# Patient Record
Sex: Male | Born: 1975 | Race: White | Hispanic: No | Marital: Single | Smoking: Former smoker
Health system: Southern US, Community
[De-identification: ages and names within clinical notes are randomized; demographics above are authoritative.]

## PROBLEM LIST (undated history)

## (undated) ENCOUNTER — Emergency Department (HOSPITAL_COMMUNITY): Discharge status: Absent without leave | Payer: BLUE CROSS/BLUE SHIELD

## (undated) DIAGNOSIS — G43909 Migraine, unspecified, not intractable, without status migrainosus: Secondary | ICD-10-CM

## (undated) DIAGNOSIS — K589 Irritable bowel syndrome without diarrhea: Secondary | ICD-10-CM

## (undated) DIAGNOSIS — J45909 Unspecified asthma, uncomplicated: Secondary | ICD-10-CM

## (undated) DIAGNOSIS — F329 Major depressive disorder, single episode, unspecified: Secondary | ICD-10-CM

## (undated) DIAGNOSIS — R011 Cardiac murmur, unspecified: Secondary | ICD-10-CM

## (undated) DIAGNOSIS — F32A Depression, unspecified: Secondary | ICD-10-CM

## (undated) DIAGNOSIS — M1990 Unspecified osteoarthritis, unspecified site: Secondary | ICD-10-CM

## (undated) DIAGNOSIS — K219 Gastro-esophageal reflux disease without esophagitis: Secondary | ICD-10-CM

## (undated) HISTORY — PX: TONSILLECTOMY: SUR1361

## (undated) HISTORY — PX: HERNIA REPAIR: SHX51

## (undated) HISTORY — PX: COLONOSCOPY: SHX174

## (undated) HISTORY — PX: WRIST SURGERY: SHX841

---

## 2000-12-14 ENCOUNTER — Encounter: Payer: Self-pay | Admitting: Emergency Medicine

## 2000-12-14 ENCOUNTER — Emergency Department (HOSPITAL_COMMUNITY): Admission: EM | Admit: 2000-12-14 | Discharge: 2000-12-14 | Payer: Self-pay | Admitting: Emergency Medicine

## 2004-10-30 ENCOUNTER — Emergency Department (HOSPITAL_COMMUNITY): Admission: EM | Admit: 2004-10-30 | Discharge: 2004-10-30 | Payer: Self-pay | Admitting: Emergency Medicine

## 2009-05-28 ENCOUNTER — Emergency Department (HOSPITAL_BASED_OUTPATIENT_CLINIC_OR_DEPARTMENT_OTHER): Admission: EM | Admit: 2009-05-28 | Discharge: 2009-05-28 | Payer: Self-pay | Admitting: Emergency Medicine

## 2009-05-28 ENCOUNTER — Ambulatory Visit: Payer: Self-pay | Admitting: Radiology

## 2009-10-21 ENCOUNTER — Ambulatory Visit: Payer: Self-pay | Admitting: Emergency Medicine

## 2009-10-21 DIAGNOSIS — M25539 Pain in unspecified wrist: Secondary | ICD-10-CM | POA: Insufficient documentation

## 2009-10-21 DIAGNOSIS — S62109A Fracture of unspecified carpal bone, unspecified wrist, initial encounter for closed fracture: Secondary | ICD-10-CM | POA: Insufficient documentation

## 2009-10-30 ENCOUNTER — Ambulatory Visit (HOSPITAL_BASED_OUTPATIENT_CLINIC_OR_DEPARTMENT_OTHER): Admission: RE | Admit: 2009-10-30 | Discharge: 2009-10-30 | Payer: Self-pay | Admitting: Orthopedic Surgery

## 2010-04-15 NOTE — Assessment & Plan Note (Signed)
Summary: RIGHT WRIST PAIN   Vital Signs:  Patient Profile:   35 Years Old Male CC:      Rt wrist pain after hitting a punching bag x 3 days Height:     71 inches Weight:      170 pounds O2 Sat:      98 % O2 treatment:    Room Air Temp:     98.6 degrees F oral Pulse rate:   89 / minute Pulse rhythm:   regular Resp:     12 per minute BP sitting:   130 / 87  (left arm) Cuff size:   large  Pt. in pain?   yes    Intensity:   4    Type:       dull                   Current Allergies: ! * SHELLFISHHistory of Present Illness Chief Complaint: Rt wrist pain after hitting a punching bag x 3 days History of Present Illness: Developed R wrist pain 3 days ago.  Was using a punching bag, but didn't wrap his wrist.  He thinks it is sprained, but it is still swollen and tender.  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  Pain is dull with associated swelling.  Ice, elevation, and NSAID's all help somewhat.  REVIEW OF SYSTEMS Constitutional Symptoms      Denies fever, chills, night sweats, weight loss, weight gain, and fatigue.  Eyes       Denies change in vision, eye pain, eye discharge, glasses, contact lenses, and eye surgery. Ear/Nose/Throat/Mouth       Denies hearing loss/aids, change in hearing, ear pain, ear discharge, dizziness, frequent runny nose, frequent nose bleeds, sinus problems, sore throat, hoarseness, and tooth pain or bleeding.  Respiratory       Denies dry cough, productive cough, wheezing, shortness of breath, asthma, bronchitis, and emphysema/COPD.  Cardiovascular       Denies murmurs, chest pain, and tires easily with exhertion.    Gastrointestinal       Denies stomach pain, nausea/vomiting, diarrhea, constipation, blood in bowel movements, and indigestion. Genitourniary       Denies painful urination, kidney stones, and loss of urinary control. Neurological       Denies paralysis, seizures, and fainting/blackouts. Musculoskeletal       Complains of  muscle pain, joint pain, joint stiffness, decreased range of motion, and swelling.      Denies redness, muscle weakness, and gout.  Skin       Denies bruising, unusual mles/lumps or sores, and hair/skin or nail changes.  Psych       Denies mood changes, temper/anger issues, anxiety/stress, speech problems, depression, and sleep problems.  Past History:  Past Medical History: Unremarkable  Past Surgical History: Denies surgical history  Family History: Mother, D, Crohns Father, D,Throat CA  Social History: Non smoker ETOH-yes No DRugs IT Physical Exam General appearance: well developed, well nourished, no acute distress Skin: no obvious rashes or lesions MSE: oriented to time, place, and person Right wrist: FROM, DNVI, TTP at snuffbox and radial aspect.  No TTP ulnar or forearm.  Swelling over radial wrist.  Assessment New Problems: FRACTURE, WRIST, RIGHT (ICD-814.00) WRIST PAIN, RIGHT (ICD-719.43)  Xray: scaphoid waist fracture  Patient Education: Ice, rest, elevation  Plan New Orders: New Patient Level III [99203] T-DG Wrist Complete*R* [73110] Planning Comments:   Wear thumb spica splint Follow up with sports medicine  next week, they will likely place a cast at that time   The patient and/or caregiver has been counseled thoroughly with regard to medications prescribed including dosage, schedule, interactions, rationale for use, and possible side effects and they verbalize understanding.  Diagnoses and expected course of recovery discussed and will return if not improved as expected or if the condition worsens. Patient and/or caregiver verbalized understanding.   Orders Added: 1)  New Patient Level III [99203] 2)  T-DG Wrist Complete*R* [73110]  Appended Document: RIGHT WRIST PAIN Gave Right thumb spica splint to patient.

## 2010-05-30 LAB — POCT HEMOGLOBIN-HEMACUE: Hemoglobin: 15.6 g/dL (ref 13.0–17.0)

## 2010-06-09 LAB — URINALYSIS, ROUTINE W REFLEX MICROSCOPIC
Bilirubin Urine: NEGATIVE
Glucose, UA: NEGATIVE mg/dL
Hgb urine dipstick: NEGATIVE
Ketones, ur: NEGATIVE mg/dL
Nitrite: NEGATIVE
Protein, ur: NEGATIVE mg/dL
Specific Gravity, Urine: 1.006 (ref 1.005–1.030)
Urobilinogen, UA: 0.2 mg/dL (ref 0.0–1.0)
pH: 6 (ref 5.0–8.0)

## 2010-06-09 LAB — CBC
HCT: 42.8 % (ref 39.0–52.0)
Hemoglobin: 14.6 g/dL (ref 13.0–17.0)
RBC: 4.41 MIL/uL (ref 4.22–5.81)
RDW: 12.9 % (ref 11.5–15.5)
WBC: 7.2 10*3/uL (ref 4.0–10.5)

## 2010-06-09 LAB — DIFFERENTIAL
Basophils Absolute: 0.1 10*3/uL (ref 0.0–0.1)
Lymphocytes Relative: 17 % (ref 12–46)
Lymphs Abs: 1.2 10*3/uL (ref 0.7–4.0)
Monocytes Absolute: 0.4 10*3/uL (ref 0.1–1.0)
Monocytes Relative: 5 % (ref 3–12)
Neutro Abs: 5.2 10*3/uL (ref 1.7–7.7)

## 2010-06-09 LAB — BASIC METABOLIC PANEL
Calcium: 9.7 mg/dL (ref 8.4–10.5)
GFR calc Af Amer: >60 mL/min (ref >60)
GFR calc non Af Amer: >60 mL/min (ref >60)
Potassium: 4.1 mEq/L (ref 3.5–5.1)
Sodium: 145 mEq/L (ref 135–145)

## 2010-10-18 ENCOUNTER — Ambulatory Visit
Admission: RE | Admit: 2010-10-18 | Discharge: 2010-10-18 | Disposition: A | Discharge status: Home / Self Care | Payer: BC Managed Care – PPO | Source: Ambulatory Visit | Attending: Emergency Medicine | Admitting: Emergency Medicine

## 2010-10-18 ENCOUNTER — Other Ambulatory Visit: Payer: Self-pay | Admitting: Emergency Medicine

## 2010-10-18 ENCOUNTER — Inpatient Hospital Stay (INDEPENDENT_AMBULATORY_CARE_PROVIDER_SITE_OTHER)
Admission: RE | Admit: 2010-10-18 | Discharge: 2010-10-18 | Disposition: A | Discharge status: Home / Self Care | Payer: BC Managed Care – PPO | Source: Ambulatory Visit | Attending: Emergency Medicine | Admitting: Emergency Medicine

## 2010-10-18 ENCOUNTER — Encounter: Payer: Self-pay | Admitting: Emergency Medicine

## 2010-10-18 DIAGNOSIS — M25579 Pain in unspecified ankle and joints of unspecified foot: Secondary | ICD-10-CM

## 2010-10-19 ENCOUNTER — Telehealth (INDEPENDENT_AMBULATORY_CARE_PROVIDER_SITE_OTHER): Payer: Self-pay | Admitting: Emergency Medicine

## 2011-02-16 NOTE — Progress Notes (Signed)
Summary: ANKLE INJ (room 4)   Vital Signs:  Patient Profile:   35 Years Old Male CC:      twisted left ankle last evening Height:     71 inches Weight:      177 pounds O2 Sat:      99 % O2 treatment:    Room Air Temp:     98.1 degrees F oral Pulse rate:   84 / minute Resp:     16 per minute BP sitting:   141 / 85  (left arm) Cuff size:   regular  Pt. in pain?   yes    Location:   left foot/ankle    Intensity:   7    Type:       sharp                   Updated Prior Medication List: PROTONIX 40 MG TBEC (PANTOPRAZOLE SODIUM)   Current Allergies (reviewed today): ! * SHELLFISHHistory of Present Illness History from: patient Chief Complaint: twisted left ankle last evening History of Present Illness: L ankle pain since last night.  He stepped in a hole and twisted his ankle.  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  He feels that today is worse in terms of swelling and pain.  He is able to walk but it hurts.  Not using any meds or modalities.  REVIEW OF SYSTEMS Constitutional Symptoms      Denies fever, chills, night sweats, weight loss, weight gain, and fatigue.  Eyes       Denies change in vision, eye pain, eye discharge, glasses, contact lenses, and eye surgery. Ear/Nose/Throat/Mouth       Denies hearing loss/aids, change in hearing, ear pain, ear discharge, dizziness, frequent runny nose, frequent nose bleeds, sinus problems, sore throat, hoarseness, and tooth pain or bleeding.  Respiratory       Denies dry cough, productive cough, wheezing, shortness of breath, asthma, bronchitis, and emphysema/COPD.  Cardiovascular       Denies murmurs, chest pain, and tires easily with exhertion.    Gastrointestinal       Denies stomach pain, nausea/vomiting, diarrhea, constipation, blood in bowel movements, and indigestion. Genitourniary       Denies painful urination, kidney stones, and loss of urinary control. Neurological     Denies paralysis, seizures, and fainting/blackouts. Musculoskeletal       Complains of muscle pain, joint pain, joint stiffness, decreased range of motion, and swelling.      Denies redness, muscle weakness, and gout.  Skin       Denies bruising, unusual mles/lumps or sores, and hair/skin or nail changes.  Psych       Denies mood changes, temper/anger issues, anxiety/stress, speech problems, depression, and sleep problems. Other Comments: left foot/ankle injury    Past History:  Family History: Last updated: 10/18/2010 Mother, D, Crohns Father, D,Throat CA Family History Breast cancer 1st degree relative <50  Social History: Last updated: 10/21/2009 Non smoker ETOH-yes No DRugs IT  Past Medical History: Reviewed history from 10/21/2009 and no changes required. Unremarkable  Past Surgical History: Reviewed history from 10/21/2009 and no changes required. Denies surgical history  Family History: Reviewed history from 10/21/2009 and no changes required. Mother, D, Crohns Father, D,Throat CA Family History Breast cancer 1st degree relative <50  Social History: Reviewed history from 10/21/2009 and no changes required. Non smoker ETOH-yes No DRugs IT Physical Exam General  appearance: well developed, well nourished, no acute distress MSE: oriented to time, place, and person L ankle: FROM, full strength. +TTP lateral malleolus. No TTP medial malleolus, navicular, base of 5th, calcaneus, Achilles, or proximal fibula.  +swelling lateral ankle.  Mild ecchymoses. Distal NV status intact.  Antalgic gait. Assessment New Problems: FAMILY HISTORY BREAST CANCER 1ST DEGREE RELATIVE <50 (ICD-V16.3) ANKLE PAIN, LEFT (ICD-719.47)   Plan New Orders: Est. Patient Level IV [99214] T-DG Ankle Complete*L* [73610] Crutches [E0110] Ambulatory Surgical Boot ea [L3260] Planning Comments:   Xray ordered and read by radiology as nondisplaced oblique distal fibula fracture.  Will  place in boot and put NWB until seen by ortho this week to prevent movement of the bone.  He has seen Margaretha Sheffield and Eulah Pont in the past so will send back to them.  Encourage ice, elevation, rest.    The patient and/or caregiver has been counseled thoroughly with regard to medications prescribed including dosage, schedule, interactions, rationale for use, and possible side effects and they verbalize understanding.  Diagnoses and expected course of recovery discussed and will return if not improved as expected or if the condition worsens. Patient and/or caregiver verbalized understanding.   Orders Added: 1)  Est. Patient Level IV [16109] 2)  T-DG Ankle Complete*L* [73610] 3)  Crutches [E0110] 4)  Ambulatory Surgical Boot ea [L3260]

## 2011-02-16 NOTE — Telephone Encounter (Signed)
  Phone Note Call from Patient   Caller: Patient Reason for Call: Talk to Doctor Summary of Call: Patient calling to request pain medication; none was requested or ordered yesterday. Spoke with Dr.Wade and phoned in rx for: Hydrocodone5/325; 1 tablet q 8hrs as needed; #20; no refills; [facility] . Initial call taken by: Lavell Islam RN,  October 19, 2010 4:07 PM

## 2011-08-16 ENCOUNTER — Encounter: Payer: Self-pay | Admitting: *Deleted

## 2011-08-16 ENCOUNTER — Emergency Department
Admission: EM | Admit: 2011-08-16 | Discharge: 2011-08-16 | Disposition: A | Discharge status: Home / Self Care | Payer: BC Managed Care – PPO | Source: Home / Self Care | Attending: Emergency Medicine | Admitting: Emergency Medicine

## 2011-08-16 ENCOUNTER — Emergency Department
Admit: 2011-08-16 | Discharge: 2011-08-16 | Disposition: A | Discharge status: Home / Self Care | Payer: BC Managed Care – PPO

## 2011-08-16 DIAGNOSIS — M25539 Pain in unspecified wrist: Secondary | ICD-10-CM

## 2011-08-16 DIAGNOSIS — M25532 Pain in left wrist: Secondary | ICD-10-CM

## 2011-08-16 NOTE — ED Notes (Signed)
Pt injured L wrist Friday

## 2011-08-16 NOTE — ED Provider Notes (Signed)
History     CSN: [id number]  Arrival date & time 08/16/11  1535   First MD Initiated Contact with Patient 08/16/11 1538      Chief Complaint  Patient presents with  . Wrist Injury    L    (Consider location/radiation/quality/duration/timing/severity/associated sxs/prior treatment) HPI This is a right-handed white male who presents today with left wrist pain.  He was playing basketball and fell on an outstretched hand.  He states the pain is mild but constant sore.  He is concerned because he has a scaphoid fracture about 15 years ago and wants to make sure that everything is okay in the wrist.  He has not been using any medications or modalities yet.  No other injuries noted that during the fall.  History reviewed. No pertinent past medical history.  Past Surgical History  Procedure Date  . Wrist surgery     R    Family History  Problem Relation Age of Onset  . Cancer Mother   . Cancer Father     History  Substance Use Topics  . Smoking status: Never Smoker   . Smokeless tobacco: Not on file  . Alcohol Use: Yes      Review of Systems  All other systems reviewed and are negative.    Allergies  Pollen extract and Shellfish allergy  Home Medications   Current Outpatient Rx  Name Route Sig Dispense Refill  . PANTOPRAZOLE SODIUM 40 MG PO TBEC Oral Take 40 mg by mouth daily.      BP 133/97  Pulse 93  Temp(Src) 98.7 F (37.1 C) (Oral)  Resp 18  SpO2 98%  Physical Exam  Nursing note and vitals reviewed. Constitutional: He is oriented to person, place, and time. He appears well-developed and well-nourished.  HENT:  Head: Normocephalic and atraumatic.  Eyes: No scleral icterus.  Neck: Neck supple.  Cardiovascular: Regular rhythm and normal heart sounds.   Pulmonary/Chest: Effort normal and breath sounds normal. No respiratory distress.  Musculoskeletal:       Left wrist examination demonstrates full range of motion of the joints including flexion  extension ulnar and radial deviation with supination and pronation.  He has no snuffbox tenderness but generalized dorsal wrist tenderness.  There is no swelling or ecchymoses.  There is mild tenderness to palpation around the TFCC as well.  Neurological: He is alert and oriented to person, place, and time.  Skin: Skin is warm and dry.  Psychiatric: He has a normal mood and affect. His speech is normal.    ED Course  Procedures (including critical care time)  Labs Reviewed - No data to display Dg Wrist Complete Left  08/16/2011  *RADIOLOGY REPORT*  Clinical Data: Status post fall 2 days ago.  Wrist pain.  LEFT WRIST - COMPLETE 3+ VIEW  Comparison: None.  Findings: No acute bony or joint abnormality is identified.  Ulnar positive variance is noted.  Soft tissues are unremarkable.  IMPRESSION: No acute finding.  Original Report Authenticated By: Bernadene Bell. D'ALESSIO, M.D.     1. Left wrist pain       MDM   An x-ray was obtained and read by the radiologist as above.  Likely is a wrist sprain. Encourage rest, ice, compression with ACE bandage and/or a brace, and elevation of injured body part.  The role of anti-inflammatories is discussed with the patient.  We placed him in a thumb spica splint.  Marlaine Hind, MD 08/16/11 [phone]

## 2013-01-06 ENCOUNTER — Emergency Department (INDEPENDENT_AMBULATORY_CARE_PROVIDER_SITE_OTHER): Payer: BC Managed Care – PPO

## 2013-01-06 ENCOUNTER — Emergency Department
Admission: EM | Admit: 2013-01-06 | Discharge: 2013-01-06 | Disposition: A | Discharge status: Home / Self Care | Payer: BC Managed Care – PPO | Source: Home / Self Care | Attending: Family Medicine | Admitting: Family Medicine

## 2013-01-06 ENCOUNTER — Encounter: Payer: Self-pay | Admitting: Emergency Medicine

## 2013-01-06 DIAGNOSIS — M792 Neuralgia and neuritis, unspecified: Secondary | ICD-10-CM

## 2013-01-06 DIAGNOSIS — M542 Cervicalgia: Secondary | ICD-10-CM

## 2013-01-06 DIAGNOSIS — IMO0002 Reserved for concepts with insufficient information to code with codable children: Secondary | ICD-10-CM

## 2013-01-06 HISTORY — DX: Gastro-esophageal reflux disease without esophagitis: K21.9

## 2013-01-06 HISTORY — DX: Irritable bowel syndrome, unspecified: K58.9

## 2013-01-06 HISTORY — DX: Migraine, unspecified, not intractable, without status migrainosus: G43.909

## 2013-01-06 MED ORDER — PREDNISONE 20 MG PO TABS: 20.0000 mg | ORAL_TABLET | Freq: Two times a day (BID) | ORAL | Status: DC

## 2013-01-06 MED ORDER — HYDROCODONE-ACETAMINOPHEN 5-325 MG PO TABS: ORAL_TABLET | ORAL | Status: DC

## 2013-01-06 MED ORDER — CYCLOBENZAPRINE HCL 10 MG PO TABS: 10.0000 mg | ORAL_TABLET | Freq: Two times a day (BID) | ORAL | Status: DC | PRN

## 2013-01-06 NOTE — ED Notes (Signed)
Pt c/o RT sided neck pain radiating to his RT shoulder x 5 days. Denies injury. He has taken Advil and OTC Percogesic with no relief. He also reports taking a vicodin last night.

## 2013-01-06 NOTE — ED Provider Notes (Signed)
CSN: [id number]     Arrival date & time 01/06/13  1324 History   None    Chief Complaint  Patient presents with  . Neck Pain      HPI Comments: Patient reports that he has had a long history of occasional right neck stiffness that resolves spontaneously.  Five days ago he developed right neck pain that was worse than usual and has persisted.  He then developed a shooting/burning pain in his right shoulder area when rotating his head to the left, flexing his neck to the left, and looking upward.  No loss of strength.  No fevers, chills, and sweats.  He feels well otherwise.  The pain awakens him at night.  Last night he used left-over Vicodin from a previous injury.  Ibuprofen 400mg  has not been effective.  He denies recent injury or change in physical activities.  The history is provided by the patient.    Past Medical History  Diagnosis Date  . GERD (gastroesophageal reflux disease)   . IBS (irritable bowel syndrome)   . Migraine    Past Surgical History  Procedure Laterality Date  . Wrist surgery      R  . Tonsillectomy    . Wrist surgery     Family History  Problem Relation Age of Onset  . Cancer Mother     breast  . Cancer Father     esophageal   History  Substance Use Topics  . Smoking status: Never Smoker   . Smokeless tobacco: Not on file  . Alcohol Use: Yes    Review of Systems  Constitutional: Negative.   HENT: Negative.   Eyes: Negative.   Respiratory: Negative.   Cardiovascular: Negative.   Gastrointestinal: Negative.   Endocrine: Negative.   Genitourinary: Negative.   Musculoskeletal: Positive for neck pain and neck stiffness.  Skin: Negative.   Neurological: Negative for weakness, numbness and headaches.    Allergies  Pollen extract and Shellfish allergy  Home Medications   Current Outpatient Rx  Name  Route  Sig  Dispense  Refill  . pantoprazole (PROTONIX) 40 MG tablet   Oral   Take 40 mg by mouth daily.          BP 139/97  Pulse 81   Temp(Src) 98.2 F (36.8 C) (Oral)  Resp 18  Ht 5\' 10"  (1.778 m)  Wt 182 lb (82.555 kg)  BMI 26.11 kg/m2  SpO2 99% Physical Exam  Nursing note and vitals reviewed. Constitutional: He is oriented to person, place, and time. He appears well-developed and well-nourished. No distress.  HENT:  Head: Normocephalic and atraumatic.  Mouth/Throat: Oropharynx is clear and moist.  Eyes: Conjunctivae and EOM are normal. Pupils are equal, round, and reactive to light.  Neck: Neck supple. No thyromegaly present.  Cardiovascular: Normal heart sounds.   Pulmonary/Chest: Breath sounds normal.  Musculoskeletal:       Cervical back: He exhibits decreased range of motion and tenderness. He exhibits no bony tenderness, no swelling, no edema and no deformity.       Back:  Patient has a small area of mild tenderness at base of neck posteriorly as noted on diagram.  His area of radicular discomfort is noted over right trapezius and shoulder as noted on diagram.  Patient has decreased range of motion in his neck. Distal sensation and strength intact.  Elbow and patellar reflexes normal.  Lymphadenopathy:    He has no cervical adenopathy.  Neurological: He is alert and oriented to person, place, and time.  Skin: Skin is warm and dry. No rash noted.    ED Course  Procedures  none     Imaging Review Dg Cervical Spine Complete  01/06/2013   CLINICAL DATA:  Right lateral neck pain radiating into the right shoulder.  EXAM: CERVICAL SPINE  4+ VIEWS  COMPARISON:  No priors.  FINDINGS: Six views of the cervical spine demonstrate no acute displaced fracture. Alignment is anatomic. Prevertebral soft tissues are normal. Mild multilevel degenerative disc disease and facet arthropathy, most severe at C6-C7.   IMPRESSION: 1. No acute radiographic abnormality of the cervical spine to account for the patient's symptoms.   Electronically Signed   By: Trudie Reed M.D.   On: 01/06/2013 15:14      MDM   1. Neck pain on right side   2. Radicular pain in right arm; primarily C6-C7, without evidence weakness    Begin prednisone burst.  Rx for Flexeril.  Lortab at bedtime.  Begin applying ice pack several times daily.  Minimize physical activity. If symptoms become significantly worse during the night or over the weekend, proceed to the local emergency room.  Followup with Dr. Rodney Langton in one week.    Lattie Haw, MD 01/06/13 1630

## 2013-01-13 ENCOUNTER — Encounter: Payer: Self-pay | Admitting: Sports Medicine

## 2013-01-13 ENCOUNTER — Ambulatory Visit (INDEPENDENT_AMBULATORY_CARE_PROVIDER_SITE_OTHER): Payer: BC Managed Care – PPO | Admitting: Sports Medicine

## 2013-01-13 VITALS — BP 153/96 | HR 92 | Wt 185.0 lb

## 2013-01-13 DIAGNOSIS — M5412 Radiculopathy, cervical region: Secondary | ICD-10-CM

## 2013-01-13 NOTE — Progress Notes (Signed)
   Subjective:    I'm seeing this patient as a consultation for:  Dr. Cathren Harsh  CC: Neck and arm pain  HPI: This is a very pleasant 37 year old male, he comes in with neck pain radiating down the right arm, initially it did come down all the way to the fingertips. Is moderate, persistent. He went to urgent care, was given Flexeril, prednisone, Norco, and is feeling much better now. He has not been doing any rehabilitation exercises. Denies any bowel or bladder dysfunction, no constitutional symptoms, or change in gait.  Past medical history, Surgical history, Family history not pertinant except as noted below, Social history, Allergies, and medications have been entered into the medical record, reviewed, and no changes needed.   Review of Systems: No headache, visual changes, nausea, vomiting, diarrhea, constipation, dizziness, abdominal pain, skin rash, fevers, chills, night sweats, weight loss, swollen lymph nodes, body aches, joint swelling, muscle aches, chest pain, shortness of breath, mood changes, visual or auditory hallucinations.   Objective:   General: Well Developed, well nourished, and in no acute distress.  Neuro/Psych: Alert and oriented x3, extra-ocular muscles intact, able to move all 4 extremities, sensation grossly intact. Skin: Warm and dry, no rashes noted.  Respiratory: Not using accessory muscles, speaking in full sentences, trachea midline.  Cardiovascular: Pulses palpable, no extremity edema. Abdomen: Does not appear distended. Neck: Inspection unremarkable. No palpable stepoffs. Negative Spurling's maneuver. Full neck range of motion Grip strength and sensation normal in bilateral hands Strength good C4 to T1 distribution No sensory change to C4 to T1 Negative Hoffman sign bilaterally Reflexes normal  X-rays were reviewed, the usual mild DJD with anterior bridging osteophytes at the C6-C7 level.  Impression and Recommendations:   This case required medical  decision making of moderate complexity.

## 2013-01-13 NOTE — Assessment & Plan Note (Signed)
Right-sided, C7 versus C8. X-rays do confirm C6-C7 degenerative disc disease. Overall doing fantastic with prednisone and Flexeril. Adding home rehabilitation. Return one month, MRI for interventional injection planning if no better.

## 2013-02-17 ENCOUNTER — Encounter: Payer: Self-pay | Admitting: Sports Medicine

## 2013-02-17 ENCOUNTER — Ambulatory Visit (INDEPENDENT_AMBULATORY_CARE_PROVIDER_SITE_OTHER): Payer: BC Managed Care – PPO | Admitting: Sports Medicine

## 2013-02-17 VITALS — BP 139/88 | HR 85 | Wt 186.0 lb

## 2013-02-17 DIAGNOSIS — M5412 Radiculopathy, cervical region: Secondary | ICD-10-CM

## 2013-02-17 DIAGNOSIS — IMO0002 Reserved for concepts with insufficient information to code with codable children: Secondary | ICD-10-CM

## 2013-02-17 DIAGNOSIS — M5416 Radiculopathy, lumbar region: Secondary | ICD-10-CM | POA: Insufficient documentation

## 2013-02-17 MED ORDER — CYCLOBENZAPRINE HCL 10 MG PO TABS: 10.0000 mg | ORAL_TABLET | Freq: Three times a day (TID) | ORAL | Status: DC | PRN

## 2013-02-17 NOTE — Progress Notes (Signed)
  Subjective:    CC: Followup  HPI: Cervical radiculitis: Improved approximately 80+ percent better with conservative measures including physical therapy, steroids, ibuprofen, and Flexeril. Desires a refill on Flexeril and does not need to proceed any further with MRIs for intervention.  Left leg numbness colon with back pain, noted recently, symptoms did recently disappear and he does not desire to pursue treatment for this just yet. He would like some conservative measures including exercises.  Past medical history, Surgical history, Family history not pertinant except as noted below, Social history, Allergies, and medications have been entered into the medical record, reviewed, and no changes needed.   Review of Systems: No fevers, chills, night sweats, weight loss, chest pain, or shortness of breath.   Objective:    General: Well Developed, well nourished, and in no acute distress.  Neuro: Alert and oriented x3, extra-ocular muscles intact, sensation grossly intact.  HEENT: Normocephalic, atraumatic, pupils equal round reactive to light, neck supple, no masses, no lymphadenopathy, thyroid nonpalpable.  Skin: Warm and dry, no rashes. Cardiac: Regular rate and rhythm, no murmurs rubs or gallops, no lower extremity edema.  Respiratory: Clear to auscultation bilaterally. Not using accessory muscles, speaking in full sentences. Neck: Inspection unremarkable. No palpable stepoffs. Negative Spurling's maneuver. Full neck range of motion Grip strength and sensation normal in bilateral hands Strength good C4 to T1 distribution No sensory change to C4 to T1 Negative Hoffman sign bilaterally Reflexes normal Back Exam:  Inspection: Unremarkable  Motion: Flexion 45 deg, Extension 45 deg, Side Bending to 45 deg bilaterally,  Rotation to 45 deg bilaterally  SLR laying: Negative  XSLR laying: Negative  Palpable tenderness: None. FABER: negative. Sensory change: Gross sensation intact to  all lumbar and sacral dermatomes.  Reflexes: 2+ at both patellar tendons, 2+ at achilles tendons, Babinski's downgoing.  Strength at foot  Plantar-flexion: 5/5 Dorsi-flexion: 5/5 Eversion: 5/5 Inversion: 5/5  Leg strength  Quad: 5/5 Hamstring: 5/5 Hip flexor: 5/5 Hip abductors: 5/5  Gait unremarkable.  Impression and Recommendations:

## 2013-02-17 NOTE — Assessment & Plan Note (Signed)
80% improved with conservative measures, did well with Flexeril, and an occasional Advil. Refilling this. Continue home exercises, return in 3 months. No MRI or injections needed.

## 2013-02-17 NOTE — Assessment & Plan Note (Signed)
We will start conservatively with home rehabilitation exercises. Return in about 3 months for this.

## 2013-05-12 ENCOUNTER — Telehealth: Payer: Self-pay

## 2013-05-12 DIAGNOSIS — M5412 Radiculopathy, cervical region: Secondary | ICD-10-CM

## 2013-05-12 NOTE — Telephone Encounter (Signed)
Patient called request refill for Flexeril he stated that he re aggravated his back .  [facility] advise [facility] [place].Rhonda Cunningham,CMA

## 2013-05-15 MED ORDER — CYCLOBENZAPRINE HCL 10 MG PO TABS
10.0000 mg | ORAL_TABLET | Freq: Three times a day (TID) | ORAL | Status: DC | PRN
Start: 2013-05-15 — End: 2013-05-19

## 2013-05-15 NOTE — Telephone Encounter (Signed)
Done

## 2013-05-16 ENCOUNTER — Other Ambulatory Visit: Payer: Self-pay | Admitting: Family Medicine

## 2013-05-19 ENCOUNTER — Ambulatory Visit (INDEPENDENT_AMBULATORY_CARE_PROVIDER_SITE_OTHER): Payer: BC Managed Care – PPO | Admitting: Sports Medicine

## 2013-05-19 ENCOUNTER — Encounter: Payer: Self-pay | Admitting: Sports Medicine

## 2013-05-19 VITALS — BP 139/86 | HR 81 | Ht 70.0 in | Wt 186.0 lb

## 2013-05-19 DIAGNOSIS — M5412 Radiculopathy, cervical region: Secondary | ICD-10-CM

## 2013-05-19 DIAGNOSIS — IMO0002 Reserved for concepts with insufficient information to code with codable children: Secondary | ICD-10-CM

## 2013-05-19 DIAGNOSIS — M5416 Radiculopathy, lumbar region: Secondary | ICD-10-CM

## 2013-05-19 MED ORDER — CYCLOBENZAPRINE HCL 10 MG PO TABS: 10.0000 mg | ORAL_TABLET | Freq: Three times a day (TID) | ORAL | Status: DC | PRN

## 2013-05-19 NOTE — Progress Notes (Signed)
  Subjective:    CC: Followup  HPI: Left lumbar radiculitis: Resolved with conservative measures and Flexeril.  Cervical radiculitis: Resolved with conservative measures and Flexeril.  Past medical history, Surgical history, Family history not pertinant except as noted below, Social history, Allergies, and medications have been entered into the medical record, reviewed, and no changes needed.   Review of Systems: No fevers, chills, night sweats, weight loss, chest pain, or shortness of breath.   Objective:    General: Well Developed, well nourished, and in no acute distress.  Neuro: Alert and oriented x3, extra-ocular muscles intact, sensation grossly intact.  HEENT: Normocephalic, atraumatic, pupils equal round reactive to light, neck supple, no masses, no lymphadenopathy, thyroid nonpalpable.  Skin: Warm and dry, no rashes. Cardiac: Regular rate and rhythm, no murmurs rubs or gallops, no lower extremity edema.  Respiratory: Clear to auscultation bilaterally. Not using accessory muscles, speaking in full sentences. Neck: Inspection unremarkable. No palpable stepoffs. Negative Spurling's maneuver. Full neck range of motion Grip strength and sensation normal in bilateral hands Strength good C4 to T1 distribution No sensory change to C4 to T1 Negative Hoffman sign bilaterally Reflexes normal Back Exam:  Inspection: Unremarkable  Motion: Flexion 45 deg, Extension 45 deg, Side Bending to 45 deg bilaterally,  Rotation to 45 deg bilaterally  SLR laying: Negative  XSLR laying: Negative  Palpable tenderness: None. FABER: negative. Sensory change: Gross sensation intact to all lumbar and sacral dermatomes.  Reflexes: 2+ at both patellar tendons, 2+ at achilles tendons, Babinski's downgoing.  Strength at foot  Plantar-flexion: 5/5 Dorsi-flexion: 5/5 Eversion: 5/5 Inversion: 5/5  Leg strength  Quad: 5/5 Hamstring: 5/5 Hip flexor: 5/5 Hip abductors: 5/5  Gait  unremarkable.  Impression and Recommendations:

## 2013-05-19 NOTE — Assessment & Plan Note (Signed)
Resolved, refilling Flexeril.  It was inadvertently sent to the wrong pharmacy.

## 2013-05-19 NOTE — Assessment & Plan Note (Signed)
Resolved, continue rehabilitation exercises and an occasional Flexeril as needed. Return to see me on an as-needed basis.

## 2013-11-17 ENCOUNTER — Emergency Department (INDEPENDENT_AMBULATORY_CARE_PROVIDER_SITE_OTHER): Payer: BC Managed Care – PPO

## 2013-11-17 ENCOUNTER — Emergency Department
Admission: EM | Admit: 2013-11-17 | Discharge: 2013-11-17 | Disposition: A | Discharge status: Home / Self Care | Payer: BC Managed Care – PPO | Source: Home / Self Care | Attending: Family Medicine | Admitting: Family Medicine

## 2013-11-17 ENCOUNTER — Encounter: Payer: Self-pay | Admitting: Emergency Medicine

## 2013-11-17 DIAGNOSIS — S239XXA Sprain of unspecified parts of thorax, initial encounter: Secondary | ICD-10-CM

## 2013-11-17 DIAGNOSIS — S29012A Strain of muscle and tendon of back wall of thorax, initial encounter: Secondary | ICD-10-CM

## 2013-11-17 DIAGNOSIS — IMO0002 Reserved for concepts with insufficient information to code with codable children: Secondary | ICD-10-CM

## 2013-11-17 MED ORDER — METAXALONE 800 MG PO TABS: 800.0000 mg | ORAL_TABLET | Freq: Three times a day (TID) | ORAL | Status: DC

## 2013-11-17 MED ORDER — MELOXICAM 15 MG PO TABS: 15.0000 mg | ORAL_TABLET | Freq: Every day | ORAL | Status: DC

## 2013-11-17 NOTE — ED Notes (Signed)
Mid back pain x 4 days, hurts to pivot, lean back, side to side. Resting pain 3 goes to 10 when pivoting," takes my breath"

## 2013-11-17 NOTE — Discharge Instructions (Signed)
Apply ice pack for 20 to 30 minutes, 3 to 4 times daily  Continue until pain decreases. May take Norco/Vicodin at bedtime as needed.  Avoid lifting.   Back Pain, Adult Low back pain is very common. About 1 in 5 people have back pain.The cause of low back pain is rarely dangerous. The pain often gets better over time.About half of people with a sudden onset of back pain feel better in just 2 weeks. About 8 in 10 people feel better by 6 weeks.  CAUSES Some common causes of back pain include:  Strain of the muscles or ligaments supporting the spine.  Wear and tear (degeneration) of the spinal discs.  Arthritis.  Direct injury to the back. DIAGNOSIS Most of the time, the direct cause of low back pain is not known.However, back pain can be treated effectively even when the exact cause of the pain is unknown.Answering your caregiver's questions about your overall health and symptoms is one of the most accurate ways to make sure the cause of your pain is not dangerous. If your caregiver needs more information, he or she may order lab work or imaging tests (X-rays or MRIs).However, even if imaging tests show changes in your back, this usually does not require surgery. HOME CARE INSTRUCTIONS For many people, back pain returns.Since low back pain is rarely dangerous, it is often a condition that people can learn to [facility] their own.   Remain active. It is stressful on the back to sit or stand in one place. Do not sit, drive, or stand in one place for more than 30 minutes at a time. Take short walks on level surfaces as soon as pain allows.Try to increase the length of time you walk each day.  Do not stay in bed.Resting more than 1 or 2 days can delay your recovery.  Do not avoid exercise or work.Your body is made to move.It is not dangerous to be active, even though your back may hurt.Your back will likely heal faster if you return to being active before your pain is gone.  Pay  attention to your body when you bend and lift. Many people have less discomfortwhen lifting if they bend their knees, keep the load close to their bodies,and avoid twisting. Often, the most comfortable positions are those that put less stress on your recovering back.  Find a comfortable position to sleep. Use a firm mattress and lie on your side with your knees slightly bent. If you lie on your back, put a pillow under your knees.  Only take over-the-counter or prescription medicines as directed by your caregiver. Over-the-counter medicines to reduce pain and inflammation are often the most helpful.Your caregiver may prescribe muscle relaxant drugs.These medicines help dull your pain so you can more quickly return to your normal activities and healthy exercise.  Put ice on the injured area.  Put ice in a plastic bag.  Place a towel between your skin and the bag.  Leave the ice on for 15-20 minutes, 03-04 times a day for the first 2 to 3 days. After that, ice and heat may be alternated to reduce pain and spasms.  Ask your caregiver about trying back exercises and gentle massage. This may be of some benefit.  Avoid feeling anxious or stressed.Stress increases muscle tension and can worsen back pain.It is important to recognize when you are anxious or stressed and learn ways to manage it.Exercise is a great option. SEEK MEDICAL CARE IF:  You have pain that is  not relieved with rest or medicine.  You have pain that does not improve in 1 week.  You have new symptoms.  You are generally not feeling well. SEEK IMMEDIATE MEDICAL CARE IF:   You have pain that radiates from your back into your legs.  You develop new bowel or bladder control problems.  You have unusual weakness or numbness in your arms or legs.  You develop nausea or vomiting.  You develop abdominal pain.  You feel faint. Document Released: 03/02/2005 Document Revised: 09/01/2011 Document Reviewed:  07/04/2013 [facility] Patient Information 2015 [place], [place]. This information is not intended to replace advice given to you by your health care provider. Make sure you discuss any questions you have with your health care provider.

## 2013-11-17 NOTE — ED Provider Notes (Signed)
CSN: [id number]     Arrival date & time 11/17/13  1457 History   First MD Initiated Contact with Patient 11/17/13 1536     Chief Complaint  Patient presents with  . Back Pain      HPI Comments: Patient reports that he returned from an extended golfing trip eight days ago, but recalls no injury.  Four days ago he recalls lifting a box overhead, and the next day he developed a constant ache in his middle back.  The pain is worse with extension of his back, and rotation of his back.  Pain decreases when sitting quietly.  Patient is a 38 y.o. male presenting with back pain. The history is provided by the patient.  Back Pain Location:  Thoracic spine Quality:  Aching and stabbing Radiates to:  Does not radiate Pain severity:  Moderate Pain is:  Same all the time Onset quality:  Sudden Duration:  3 days Timing:  Intermittent Progression:  Unchanged Chronicity:  New Context: lifting heavy objects   Relieved by:  Nothing Worsened by:  Twisting and bending Ineffective treatments:  None tried Associated symptoms: no abdominal pain, no chest pain, no fever, no leg pain, no numbness, no tingling and no weakness     Past Medical History  Diagnosis Date  . GERD (gastroesophageal reflux disease)   . IBS (irritable bowel syndrome)   . Migraine    Past Surgical History  Procedure Laterality Date  . Wrist surgery      R  . Tonsillectomy    . Wrist surgery     Family History  Problem Relation Age of Onset  . Cancer Mother     breast  . Cancer Father     esophageal   History  Substance Use Topics  . Smoking status: Former Smoker    Types: Cigarettes    Quit date: 11/18/1998  . Smokeless tobacco: Not on file  . Alcohol Use: Yes    Review of Systems  Constitutional: Negative for fever.  Cardiovascular: Negative for chest pain.  Gastrointestinal: Negative for abdominal pain.  Musculoskeletal: Positive for back pain.  Neurological: Negative for tingling, weakness and numbness.    All other systems reviewed and are negative.   Allergies  Pollen extract and Shellfish allergy  Home Medications   Prior to Admission medications   Medication Sig Start Date End Date Taking? Authorizing Provider  HYDROcodone-acetaminophen (NORCO/VICODIN) 5-325 MG per tablet Take one by mouth at bedtime as needed for pain 01/06/13   Lattie Haw, MD  meloxicam (MOBIC) 15 MG tablet Take 1 tablet (15 mg total) by mouth daily. Take with food each morning 11/17/13   Lattie Haw, MD  metaxalone (SKELAXIN) 800 MG tablet Take 1 tablet (800 mg total) by mouth 3 (three) times daily. 11/17/13   Lattie Haw, MD  pantoprazole (PROTONIX) 40 MG tablet Take 40 mg by mouth daily.    Historical Provider, MD   BP 149/99  Pulse 87  Temp(Src) 98.3 F (36.8 C) (Oral)  Ht  (1.778 m)  Wt 180 lb (81.647 kg)  BMI 25.83 kg/m2  SpO2 100% Physical Exam  Nursing note and vitals reviewed. Constitutional: He is oriented to person, place, and time. He appears well-developed and well-nourished. No distress.  HENT:  Head: Normocephalic.  Mouth/Throat: Oropharynx is clear and moist.  Eyes: Conjunctivae are normal. Pupils are equal, round, and reactive to light.  Neck: Normal range of motion.  Cardiovascular: Normal heart sounds.   Pulmonary/Chest: Breath  sounds normal.  Abdominal: There is no tenderness.  Musculoskeletal: He exhibits no edema.       Thoracic back: He exhibits tenderness.       Back:  There is tenderness mid-thoracic back as noted on diagram.    Lymphadenopathy:    He has no cervical adenopathy.  Neurological: He is alert and oriented to person, place, and time.  Skin: Skin is warm and dry. No rash noted.    ED Course  Procedures  None    Imaging Review Dg Thoracic Spine 2 View  11/17/2013   CLINICAL DATA:  Three days of lower thoracic spine pain  EXAM: THORACIC SPINE - 2 VIEW  COMPARISON:  Abdominal series of May 28, 2009  FINDINGS: The images are technically limited.  The thoracic vertebral bodies are preserved in height. The intervertebral disc spaces exhibit mild narrowing diffusely where visualized. There is no spondylolisthesis. There are no abnormal paravertebral soft tissue densities.  IMPRESSION: There is no acute bony abnormality of the lumbar spine. Mild degenerative disc space narrowing is present at multiple levels.   Electronically Signed   By: David  [place]   On: 11/17/2013 16:18     MDM   1. Upper back strain, initial encounter    Begin Skelaxin and Mobic. Apply ice pack for 20 to 30 minutes, 3 to 4 times daily  Continue until pain decreases. May take Norco/Vicodin at bedtime as needed.  Avoid lifting. Followup with Dr. Rodney [facility] ([facility]) if not improving about two weeks.     Lattie Haw, MD 11/20/13 [phone]

## 2014-09-12 ENCOUNTER — Emergency Department (INDEPENDENT_AMBULATORY_CARE_PROVIDER_SITE_OTHER): Payer: BLUE CROSS/BLUE SHIELD

## 2014-09-12 ENCOUNTER — Encounter: Payer: Self-pay | Admitting: *Deleted

## 2014-09-12 ENCOUNTER — Emergency Department (INDEPENDENT_AMBULATORY_CARE_PROVIDER_SITE_OTHER)
Admission: EM | Admit: 2014-09-12 | Discharge: 2014-09-12 | Disposition: A | Discharge status: Home / Self Care | Payer: BLUE CROSS/BLUE SHIELD | Source: Home / Self Care | Attending: Family Medicine | Admitting: Family Medicine

## 2014-09-12 DIAGNOSIS — M25531 Pain in right wrist: Secondary | ICD-10-CM | POA: Diagnosis not present

## 2014-09-12 DIAGNOSIS — S63501A Unspecified sprain of right wrist, initial encounter: Secondary | ICD-10-CM | POA: Diagnosis not present

## 2014-09-12 NOTE — ED Provider Notes (Signed)
CSN: [id number]     Arrival date & time 09/12/14  1454 History   First MD Initiated Contact with Patient 09/12/14 1536     Chief Complaint  Patient presents with  . Wrist Pain      HPI Comments: Patient was working out with a boxing bag at home just over two weeks ago when he developed soreness in his right wrist that has persisted.  He has a past history of right scaphoid fracture in 2012 with ORIF.   Patient is a 39 y.o. male presenting with wrist pain. The history is provided by the patient.  Wrist Pain This is a new problem. Episode onset: 2 weeks ago. The problem occurs constantly. The problem has been gradually improving. Exacerbated by: grasping. Nothing relieves the symptoms. He has tried nothing for the symptoms.    Past Medical History  Diagnosis Date  . GERD (gastroesophageal reflux disease)   . IBS (irritable bowel syndrome)   . Migraine    Past Surgical History  Procedure Laterality Date  . Wrist surgery      R  . Tonsillectomy    . Wrist surgery    . Hernia repair     Family History  Problem Relation Age of Onset  . Cancer Mother     breast  . Cancer Father     esophageal  . Diabetes Father    History  Substance Use Topics  . Smoking status: Former Smoker    Types: Cigarettes    Quit date: 11/18/1998  . Smokeless tobacco: Not on file  . Alcohol Use: Yes     Comment: 15-20 q wk    Review of Systems  All other systems reviewed and are negative.   Allergies  Pollen extract and Shellfish allergy  Home Medications   Prior to Admission medications   Medication Sig Start Date End Date Taking? Authorizing Provider  HYDROcodone-acetaminophen (NORCO/VICODIN) 5-325 MG per tablet Take one by mouth at bedtime as needed for pain 01/06/13   Lattie Haw, MD  meloxicam (MOBIC) 15 MG tablet Take 1 tablet (15 mg total) by mouth daily. Take with food each morning 11/17/13   Lattie Haw, MD  metaxalone (SKELAXIN) 800 MG tablet Take 1 tablet (800 mg total) by  mouth 3 (three) times daily. 11/17/13   Lattie Haw, MD  pantoprazole (PROTONIX) 40 MG tablet Take 40 mg by mouth daily.    Historical Provider, MD   BP 176/100 mmHg  Pulse 98  Temp(Src) 98.4 F (36.9 C) (Oral)  Resp 16  Ht  (1.803 m)  Wt 186 lb (84.369 kg)  BMI 25.95 kg/m2  SpO2 98% Physical Exam  Constitutional: He is oriented to person, place, and time. He appears well-developed and well-nourished. No distress.  HENT:  Head: Atraumatic.  Eyes: Conjunctivae are normal. Pupils are equal, round, and reactive to light.  Musculoskeletal:       Right wrist: He exhibits tenderness and bony tenderness. He exhibits normal range of motion, no swelling and no crepitus.       Arms: Right wrist has full range of motion.  No swelling.  There is mild tenderness over the snuff box.  Distal neurovascular function is intact.   Neurological: He is alert and oriented to person, place, and time.  Skin: Skin is warm and dry.    ED Course  Procedures  none  Imaging Review Dg Wrist Complete Right  09/12/2014   CLINICAL DATA:  Initial encounter for radial sided  pain after trauma 10 days ago. Surgery 4 years ago.  EXAM: RIGHT WRIST - COMPLETE 3+ VIEW  COMPARISON:  10/21/2009  FINDINGS: Screw fixation of the scaphoid. No acute fracture or dislocation. No acute hardware complication.  IMPRESSION: No acute osseous abnormality.   Electronically Signed   By: Jeronimo GreavesKyle  Talbot M.D.   On: 09/12/2014 15:27     MDM   1. Sprain of right wrist, initial encounter     Wear wrist brace for about 5 days.  Begin range of motion exercises.  Followup with orthopedist if symptoms persist.    Lattie HawStephen A Reine Bristow, MD 09/12/14 [phone]

## 2014-09-12 NOTE — ED Notes (Signed)
Pt c/o RT wrist pain x 1 1/2 wk after hitting a boxing bag at home. History of RT wrist fracture with screw in 2012.

## 2014-09-12 NOTE — Discharge Instructions (Signed)
Wear wrist brace for about 5 days.  Begin range of motion exercises.

## 2014-10-25 ENCOUNTER — Ambulatory Visit (INDEPENDENT_AMBULATORY_CARE_PROVIDER_SITE_OTHER): Payer: BLUE CROSS/BLUE SHIELD | Admitting: Sports Medicine

## 2014-10-25 ENCOUNTER — Ambulatory Visit (INDEPENDENT_AMBULATORY_CARE_PROVIDER_SITE_OTHER): Payer: BLUE CROSS/BLUE SHIELD

## 2014-10-25 ENCOUNTER — Encounter: Payer: Self-pay | Admitting: Sports Medicine

## 2014-10-25 VITALS — BP 144/94 | HR 87 | Ht 70.0 in | Wt 186.0 lb

## 2014-10-25 DIAGNOSIS — M79672 Pain in left foot: Secondary | ICD-10-CM

## 2014-10-25 MED ORDER — PREDNISONE 10 MG (21) PO TBPK: ORAL_TABLET | ORAL | Status: DC

## 2014-10-25 MED ORDER — METAXALONE 800 MG PO TABS: 800.0000 mg | ORAL_TABLET | Freq: Three times a day (TID) | ORAL | Status: DC

## 2014-10-25 NOTE — Progress Notes (Signed)
   Subjective:    I'm seeing this patient as a consultation for:  Dr. Dewain Penning  CC: left foot pain  HPI: For the past several weeks this pleasant 39 year male has had increasing pain and swelling of the dorsum of his midfoot without radiation, no trauma, pain is moderate, persistent. No history of gout.  Past medical history, Surgical history, Family history not pertinant except as noted below, Social history, Allergies, and medications have been entered into the medical record, reviewed, and no changes needed.   Review of Systems: No headache, visual changes, nausea, vomiting, diarrhea, constipation, dizziness, abdominal pain, skin rash, fevers, chills, night sweats, weight loss, swollen lymph nodes, body aches, joint swelling, muscle aches, chest pain, shortness of breath, mood changes, visual or auditory hallucinations.   Objective:   General: Well Developed, well nourished, and in no acute distress.  Neuro/Psych: Alert and oriented x3, extra-ocular muscles intact, able to move all 4 extremities, sensation grossly intact. Skin: Warm and dry, no rashes noted.  Respiratory: Not using accessory muscles, speaking in full sentences, trachea midline.  Cardiovascular: Pulses palpable, no extremity edema. Abdomen: Does not appear distended. Right Foot: No visible erythema or swelling. Range of motion is full in all directions. Strength is 5/5 in all directions. No hallux valgus. No pes cavus or pes planus. No abnormal callus noted. No pain over the navicular prominence, or base of fifth metatarsal. No tenderness to palpation of the calcaneal insertion of plantar fascia. No pain at the Achilles insertion. No pain over the calcaneal bursa. No pain of the retrocalcaneal bursa. No tenderness to palpation over the tarsals, metatarsals, or phalanges. No hallux rigidus or limitus. Tender to palpation at the dorsal midfoot at the base of the third and fourth metatarsals in the junction with  the lateral cuneiform as well as cuboid No pain with compression of the metatarsal heads. Neurovascularly intact distally.  X-rays reviewed and are unremarkable  Impression and Recommendations:   This case required medical decision making of moderate complexity.

## 2014-10-25 NOTE — Assessment & Plan Note (Signed)
Dorsal midfoot pain suggestive of intertarsal synovitis. We will start conservatively with prednisone, x-rays, uric acid levels, return for custom molded orthotics. If no improvement we will try and intertarsal injection. 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

## 2014-10-26 LAB — URIC ACID: Uric Acid, Serum: 6.2 mg/dL (ref 4.0–7.8)

## 2014-11-01 ENCOUNTER — Encounter: Payer: Self-pay | Admitting: Sports Medicine

## 2014-11-01 ENCOUNTER — Ambulatory Visit (INDEPENDENT_AMBULATORY_CARE_PROVIDER_SITE_OTHER): Payer: BLUE CROSS/BLUE SHIELD | Admitting: Sports Medicine

## 2014-11-01 VITALS — BP 142/97 | HR 87 | Wt 184.0 lb

## 2014-11-01 DIAGNOSIS — M79672 Pain in left foot: Secondary | ICD-10-CM

## 2014-11-01 MED ORDER — TRAMADOL HCL 50 MG PO TABS: ORAL_TABLET | ORAL | Status: DC

## 2014-11-01 NOTE — Assessment & Plan Note (Signed)
Persistent pain over the dorsum of the second metatarsal shaft radiating into the second metatarsophalangeal joint. No response to prednisone. Custom orthotics as above, x-rays were negative, at this point it is starting to appear like a second metatarsal stress reaction. Postop shoe. He does have a golf tournament coming up, he will place the orthotics into the golf shoes, and then switch back into the postop shoe when not on the golf course. Return in 4 weeks, MRI if no better.

## 2014-11-01 NOTE — Progress Notes (Signed)

## 2014-11-29 ENCOUNTER — Ambulatory Visit (INDEPENDENT_AMBULATORY_CARE_PROVIDER_SITE_OTHER): Payer: BLUE CROSS/BLUE SHIELD | Admitting: Sports Medicine

## 2014-11-29 ENCOUNTER — Encounter: Payer: Self-pay | Admitting: Sports Medicine

## 2014-11-29 DIAGNOSIS — M79672 Pain in left foot: Secondary | ICD-10-CM

## 2014-11-29 MED ORDER — CALCIUM CARBONATE-VITAMIN D 600-400 MG-UNIT PO TABS: 1.0000 | ORAL_TABLET | Freq: Two times a day (BID) | ORAL | Status: DC

## 2014-11-29 NOTE — Assessment & Plan Note (Signed)
Persistent pain despite custom orthotics and postop shoe. Pain is centered directly over the second metatarsal shaft dorsally, this is highly suggestive of stress injury. His only minimal, minus the golf tournament, I will have him do a calcium and vitamin D supplement twice a day, we can expect 1-3 months for stress injury to heal. If persistent pain after 6 weeks we will proceed into a boot with limited weightbearing. Patient already has a boot and crutches

## 2014-11-29 NOTE — Progress Notes (Signed)
  Subjective:    CC: follow-up  HPI: Left foot pain: Suspected metatarsal stress injury, persistent pain over the second metatarsal shaft dorsally, we did place him in a postop shoe last time, he recently did a golf tournament and unfortunately returns with pain almost the same as before. Symptoms are moderate, persistent without radiation. No trauma.  Past medical history, Surgical history, Family history not pertinant except as noted below, Social history, Allergies, and medications have been entered into the medical record, reviewed, and no changes needed.   Review of Systems: No fevers, chills, night sweats, weight loss, chest pain, or shortness of breath.   Objective:    General: Well Developed, well nourished, and in no acute distress.  Neuro: Alert and oriented x3, extra-ocular muscles intact, sensation grossly intact.  HEENT: Normocephalic, atraumatic, pupils equal round reactive to light, neck supple, no masses, no lymphadenopathy, thyroid nonpalpable.  Skin: Warm and dry, no rashes. Cardiac: Regular rate and rhythm, no murmurs rubs or gallops, no lower extremity edema.  Respiratory: Clear to auscultation bilaterally. Not using accessory muscles, speaking in full sentences. Left Foot: No visible erythema or swelling. Range of motion is full in all directions. Strength is 5/5 in all directions. No hallux valgus. No pes cavus or pes planus. No abnormal callus noted. No pain over the navicular prominence, or base of fifth metatarsal. No tenderness to palpation of the calcaneal insertion of plantar fascia. No pain at the Achilles insertion. No pain over the calcaneal bursa. No pain of the retrocalcaneal bursa. Tender to palpation of the dorsum of the second metatarsal shaft No hallux rigidus or limitus. No tenderness palpation over interphalangeal joints. No pain with compression of the metatarsal heads. Neurovascularly intact distally.  Impression and Recommendations:

## 2015-01-10 ENCOUNTER — Encounter: Payer: Self-pay | Admitting: Sports Medicine

## 2015-01-10 ENCOUNTER — Ambulatory Visit (INDEPENDENT_AMBULATORY_CARE_PROVIDER_SITE_OTHER): Payer: BLUE CROSS/BLUE SHIELD | Admitting: Sports Medicine

## 2015-01-10 DIAGNOSIS — M79672 Pain in left foot: Secondary | ICD-10-CM | POA: Diagnosis not present

## 2015-01-10 MED ORDER — TRAMADOL HCL 50 MG PO TABS: 50.0000 mg | ORAL_TABLET | Freq: Two times a day (BID) | ORAL | Status: DC | PRN

## 2015-01-10 NOTE — Progress Notes (Signed)
  Subjective:    CC: Follow-up  HPI: James Butler returns, we have been treating him now for 6 weeks for his metatarsal stress fracture, he is approximately 60% better and continuing to improve, he is compliant with his postop shoe but has not been using the calcium and vitamin D supplementation.  Past medical history, Surgical history, Family history not pertinant except as noted below, Social history, Allergies, and medications have been entered into the medical record, reviewed, and no changes needed.   Review of Systems: No fevers, chills, night sweats, weight loss, chest pain, or shortness of breath.   Objective:    General: Well Developed, well nourished, and in no acute distress.  Neuro: Alert and oriented x3, extra-ocular muscles intact, sensation grossly intact.  HEENT: Normocephalic, atraumatic, pupils equal round reactive to light, neck supple, no masses, no lymphadenopathy, thyroid nonpalpable.  Skin: Warm and dry, no rashes. Cardiac: Regular rate and rhythm, no murmurs rubs or gallops, no lower extremity edema.  Respiratory: Clear to auscultation bilaterally. Not using accessory muscles, speaking in full sentences. Left Foot: No visible erythema or swelling. Range of motion is full in all directions. Strength is 5/5 in all directions. No hallux valgus. No pes cavus or pes planus. No abnormal callus noted. No pain over the navicular prominence, or base of fifth metatarsal. No tenderness to palpation of the calcaneal insertion of plantar fascia. No pain at the Achilles insertion. No pain over the calcaneal bursa. No pain of the retrocalcaneal bursa. No tenderness to palpation over the tarsals, metatarsals, or phalanges. No hallux rigidus or limitus. No tenderness palpation over interphalangeal joints. No pain with compression of the metatarsal heads. Neurovascularly intact distally.  Impression and Recommendations:

## 2015-01-10 NOTE — Assessment & Plan Note (Signed)
60+ percent better since the start, we have done immobilization in a postop shoe for approximate 6 weeks now. I can no longer elicited tenderness on palpation. He needs to take the calcium and vitamin D supplement, and continue the postop shoe with orthotics for an additional month. Return to see me in one more month, we are going to refill the tramadol.

## 2015-02-13 ENCOUNTER — Encounter: Payer: Self-pay | Admitting: Sports Medicine

## 2015-02-13 ENCOUNTER — Ambulatory Visit (INDEPENDENT_AMBULATORY_CARE_PROVIDER_SITE_OTHER): Payer: BLUE CROSS/BLUE SHIELD | Admitting: Sports Medicine

## 2015-02-13 VITALS — BP 138/88 | HR 104 | Wt 191.0 lb

## 2015-02-13 DIAGNOSIS — M79672 Pain in left foot: Secondary | ICD-10-CM | POA: Diagnosis not present

## 2015-02-13 NOTE — Assessment & Plan Note (Signed)
Essentially pain-free now after over 2 months postop shoe immobilization. Discontinue postop shoe and return as needed.

## 2015-02-13 NOTE — Progress Notes (Signed)
  Subjective:    CC: Follow-up  HPI: James Butler returns, his metatarsal stress injury has resolved now after 2-1/2 months of postop shoe immobilization.  Past medical history, Surgical history, Family history not pertinant except as noted below, Social history, Allergies, and medications have been entered into the medical record, reviewed, and no changes needed.   Review of Systems: No fevers, chills, night sweats, weight loss, chest pain, or shortness of breath.   Objective:    General: Well Developed, well nourished, and in no acute distress.  Neuro: Alert and oriented x3, extra-ocular muscles intact, sensation grossly intact.  HEENT: Normocephalic, atraumatic, pupils equal round reactive to light, neck supple, no masses, no lymphadenopathy, thyroid nonpalpable.  Skin: Warm and dry, no rashes. Cardiac: Regular rate and rhythm, no murmurs rubs or gallops, no lower extremity edema.  Respiratory: Clear to auscultation bilaterally. Not using accessory muscles, speaking in full sentences. Left Foot: No visible erythema or swelling. Range of motion is full in all directions. Strength is 5/5 in all directions. No hallux valgus. No pes cavus or pes planus. No abnormal callus noted. No pain over the navicular prominence, or base of fifth metatarsal. No tenderness to palpation of the calcaneal insertion of plantar fascia. No pain at the Achilles insertion. No pain over the calcaneal bursa. No pain of the retrocalcaneal bursa. No tenderness to palpation over the tarsals, metatarsals, or phalanges. No hallux rigidus or limitus. No tenderness palpation over interphalangeal joints. No pain with compression of the metatarsal heads. Neurovascularly intact distally.  Impression and Recommendations:

## 2015-05-20 ENCOUNTER — Emergency Department
Admission: EM | Admit: 2015-05-20 | Discharge: 2015-05-20 | Disposition: A | Discharge status: Home / Self Care | Payer: BLUE CROSS/BLUE SHIELD | Source: Home / Self Care | Attending: Family Medicine | Admitting: Family Medicine

## 2015-05-20 ENCOUNTER — Encounter: Payer: Self-pay | Admitting: Emergency Medicine

## 2015-05-20 DIAGNOSIS — J209 Acute bronchitis, unspecified: Secondary | ICD-10-CM | POA: Diagnosis not present

## 2015-05-20 MED ORDER — AZITHROMYCIN 250 MG PO TABS: 250.0000 mg | ORAL_TABLET | Freq: Every day | ORAL | Status: DC

## 2015-05-20 MED ORDER — BENZONATATE 100 MG PO CAPS: 100.0000 mg | ORAL_CAPSULE | Freq: Three times a day (TID) | ORAL | Status: DC

## 2015-05-20 NOTE — Discharge Instructions (Signed)
You may take 400-600mg Ibuprofen (Motrin) every 6-8 hours for fever and pain  °Alternate with Tylenol  °You may take 500mg Tylenol every 4-6 hours as needed for fever and pain  °Follow-up with your primary care provider next week for recheck of symptoms if not improving.  °Be sure to drink plenty of fluids and rest, at least 8hrs of sleep a night, preferably more while you are sick. °Return urgent care or go to closest ER if you cannot keep down fluids/signs of dehydration, fever not reducing with Tylenol, difficulty breathing/wheezing, stiff neck, worsening condition, or other concerns (see below)  °Please take antibiotics as prescribed and be sure to complete entire course even if you start to feel better to ensure infection does not come back. ° ° °Acute Bronchitis °Bronchitis is inflammation of the airways that extend from the windpipe into the lungs (bronchi). The inflammation often causes mucus to develop. This leads to a cough, which is the most common symptom of bronchitis.  °In acute bronchitis, the condition usually develops suddenly and goes away over time, usually in a couple weeks. Smoking, allergies, and asthma can make bronchitis worse. Repeated episodes of bronchitis may cause further lung problems.  °CAUSES °Acute bronchitis is most often caused by the same virus that causes a cold. The virus can spread from person to person (contagious) through coughing, sneezing, and touching contaminated objects. °SIGNS AND SYMPTOMS  °· Cough.   °· Fever.   °· Coughing up mucus.   °· Body aches.   °· Chest congestion.   °· Chills.   °· Shortness of breath.   °· Sore throat.   °DIAGNOSIS  °Acute bronchitis is usually diagnosed through a physical exam. Your health care provider will also ask you questions about your medical history. Tests, such as chest X-rays, are sometimes done to rule out other conditions.  °TREATMENT  °Acute bronchitis usually goes away in a couple weeks. Oftentimes, no medical treatment is  necessary. Medicines are sometimes given for relief of fever or cough. Antibiotic medicines are usually not needed but may be prescribed in certain situations. In some cases, an inhaler may be recommended to help reduce shortness of breath and control the cough. A cool mist vaporizer may also be used to help thin bronchial secretions and make it easier to clear the chest.  °HOME CARE INSTRUCTIONS °· Get plenty of rest.   °· Drink enough fluids to keep your urine clear or pale yellow (unless you have a medical condition that requires fluid restriction). Increasing fluids may help thin your respiratory secretions (sputum) and reduce chest congestion, and it will prevent dehydration.   °· Take medicines only as directed by your health care provider. °· If you were prescribed an antibiotic medicine, finish it all even if you start to feel better. °· Avoid smoking and secondhand smoke. Exposure to cigarette smoke or irritating chemicals will make bronchitis worse. If you are a smoker, consider using nicotine gum or skin patches to help control withdrawal symptoms. Quitting smoking will help your lungs heal faster.   °· Reduce the chances of another bout of acute bronchitis by washing your hands frequently, avoiding people with cold symptoms, and trying not to touch your hands to your mouth, nose, or eyes.   °· Keep all follow-up visits as directed by your health care provider.   °SEEK MEDICAL CARE IF: °Your symptoms do not improve after 1 week of treatment.  °SEEK IMMEDIATE MEDICAL CARE IF: °· You develop an increased fever or chills.   °· You have chest pain.   °· You have severe shortness   of breath. °· You have bloody sputum.   °· You develop dehydration. °· You faint or repeatedly feel like you are going to pass out. °· You develop repeated vomiting. °· You develop a severe headache. °MAKE SURE YOU:  °· Understand these instructions. °· Will watch your condition. °· Will get help right away if you are not doing well  or get worse. °  °This information is not intended to replace advice given to you by your health care provider. Make sure you discuss any questions you have with your health care provider. °  °Document Released: 04/09/2004 Document Revised: 03/23/2014 Document Reviewed: 08/23/2012 °Elsevier Interactive Patient Education ©2016 [employer] ° °

## 2015-05-20 NOTE — ED Provider Notes (Signed)
CSN: [id number]     Arrival date & time 05/20/15  1532 History   First MD Initiated Contact with Patient 05/20/15 1553     Chief Complaint  Patient presents with  . Cough   (Consider location/radiation/quality/duration/timing/severity/associated sxs/prior Treatment) HPI  The pt is a 40yo male presenting to [facility] with concern for possible bronchitis.  He reports having gradually worsening moderately intermittent productive cough that is worse at night, associated sinus drainage, body aches, fatigue and fever Tmax 101*F for the last 4 days.  Fever does wax and wane throughout the day but is worse at night.  He has been taking OTC cough/cold medication including Sudafed, guaifenesin, acetaminophen and ibuprofen.  Denies n/v/d. No recent travel or sick contacts. Denies hx of asthma.   Past Medical History  Diagnosis Date  . GERD (gastroesophageal reflux disease)   . IBS (irritable bowel syndrome)   . Migraine    Past Surgical History  Procedure Laterality Date  . Wrist surgery      R  . Tonsillectomy    . Wrist surgery    . Hernia repair     Family History  Problem Relation Age of Onset  . Cancer Mother     breast  . Cancer Father     esophageal  . Diabetes Father    Social History  Substance Use Topics  . Smoking status: Former Smoker    Types: Cigarettes    Quit date: 11/18/1998  . Smokeless tobacco: None  . Alcohol Use: Yes     Comment: 15-20 q wk    Review of Systems  Constitutional: Positive for fever, chills and fatigue.  HENT: Positive for congestion, ear pain, postnasal drip, rhinorrhea, sinus pressure, sneezing and sore throat. Negative for trouble swallowing and voice change.   Respiratory: Positive for cough. Negative for shortness of breath.   Cardiovascular: Negative for chest pain and palpitations.  Gastrointestinal: Negative for nausea, vomiting, abdominal pain and diarrhea.  Musculoskeletal: Positive for myalgias and arthralgias. Negative for back pain.   Skin: Negative for rash.  Neurological: Positive for headaches. Negative for dizziness and light-headedness.    Allergies  Pollen extract and Shellfish allergy  Home Medications   Prior to Admission medications   Medication Sig Start Date End Date Taking? Authorizing Provider  azithromycin (ZITHROMAX) 250 MG tablet Take 1 tablet (250 mg total) by mouth daily. Take first 2 tablets together, then 1 every day until finished. 05/20/15   Junius Finner, PA-C  benzonatate (TESSALON) 100 MG capsule Take 1-2 capsules (100-200 mg total) by mouth every 8 (eight) hours. 05/20/15   Junius Finner, PA-C  Calcium Carbonate-Vitamin D 600-400 MG-UNIT per tablet Take 1 tablet by mouth 2 (two) times daily. 11/29/14   Monica Becton, MD  metaxalone (SKELAXIN) 800 MG tablet Take 1 tablet (800 mg total) by mouth 3 (three) times daily. 10/25/14   Monica Becton, MD  pantoprazole (PROTONIX) 40 MG tablet Take 40 mg by mouth daily.    Historical Provider, MD  traMADol (ULTRAM) 50 MG tablet Take 1 tablet (50 mg total) by mouth every 12 (twelve) hours as needed. 01/10/15   Monica Becton, MD   Meds Ordered and Administered this Visit  Medications - No data to display  BP 134/88 mmHg  Pulse 100  Temp(Src) 98.5 F (36.9 C) (Oral)  Ht  (1.778 m)  Wt 185 lb (83.915 kg)  BMI 26.54 kg/m2  SpO2 96% No data found.   Physical Exam  Constitutional: He  appears well-developed and well-nourished.  HENT:  Head: Normocephalic and atraumatic.  Right Ear: Tympanic membrane normal.  Left Ear: Tympanic membrane normal.  Nose: Mucosal edema and rhinorrhea present.  Mouth/Throat: Uvula is midline, oropharynx is clear and moist and mucous membranes are normal.  Eyes: Conjunctivae are normal. No scleral icterus.  Neck: Normal range of motion. Neck supple.  Cardiovascular: Normal rate, regular rhythm and normal heart sounds.   Pulmonary/Chest: Effort normal. No respiratory distress. He has no wheezes.  He has rhonchi in the right lower field and the left lower field. He has no rales.  Coarse breath sounds in lower lung fields bilaterally. No wheeze or respiratory distress.   Abdominal: Soft. He exhibits no distension. There is no tenderness.  Musculoskeletal: Normal range of motion.  Neurological: He is alert.  Skin: Skin is warm and dry.  Nursing note and vitals reviewed.   ED Course  Procedures (including critical care time)  Labs Review Labs Reviewed - No data to display  Imaging Review No results found.    MDM   1. Acute bronchitis, unspecified organism    Pt c/o 4 days of worsening URI symptoms with fever of 101*F  Coarse breath sounds on exam.   O2 Sat 96% on RA. No evidence of respiratory distress.  Will cover for atypical bacteria Rx: Azithromycin and Tessalon  Advised pt to use acetaminophen and ibuprofen as needed for fever and pain. Encouraged rest and fluids. F/u with PCP in 7-10 days if not improving, sooner if worsening. Pt verbalized understanding and agreement with tx plan.     Junius Finnerrin O'Malley, PA-C 05/20/15 [phone]

## 2015-05-20 NOTE — ED Notes (Signed)
Cough, Fever, 101, eyes burning, sinus drainage, body aches, no energy, ear pain x 4 days

## 2015-05-24 ENCOUNTER — Telehealth: Payer: Self-pay

## 2015-11-05 ENCOUNTER — Ambulatory Visit (INDEPENDENT_AMBULATORY_CARE_PROVIDER_SITE_OTHER): Payer: BLUE CROSS/BLUE SHIELD | Admitting: Sports Medicine

## 2015-11-05 DIAGNOSIS — M654 Radial styloid tenosynovitis [de Quervain]: Secondary | ICD-10-CM

## 2015-11-05 DIAGNOSIS — L723 Sebaceous cyst: Secondary | ICD-10-CM

## 2015-11-05 DIAGNOSIS — R2241 Localized swelling, mass and lump, right lower limb: Secondary | ICD-10-CM | POA: Insufficient documentation

## 2015-11-05 DIAGNOSIS — M5416 Radiculopathy, lumbar region: Secondary | ICD-10-CM

## 2015-11-05 MED ORDER — CYCLOBENZAPRINE HCL 10 MG PO TABS: 10.0000 mg | ORAL_TABLET | Freq: Three times a day (TID) | ORAL | 11 refills | Status: DC | PRN

## 2015-11-05 NOTE — Assessment & Plan Note (Signed)
Refilling Flexeril, otherwise doing well.

## 2015-11-05 NOTE — Progress Notes (Signed)
  Subjective:    CC: Follow-up  HPI: Low back pain: Well controlled with occasional Flexeril, needs a refill.  Skin mass: Sebaceous cyst on the right outer leg, has not yet had surgical excision but is agreeable to let me do this. Symptoms are mild, persistent, no pain, no radiation.  Past medical history, Surgical history, Family history not pertinant except as noted below, Social history, Allergies, and medications have been entered into the medical record, reviewed, and no changes needed.   Review of Systems: No fevers, chills, night sweats, weight loss, chest pain, or shortness of breath.   Objective:    General: Well Developed, well nourished, and in no acute distress.  Neuro: Alert and oriented x3, extra-ocular muscles intact, sensation grossly intact.  HEENT: Normocephalic, atraumatic, pupils equal round reactive to light, neck supple, no masses, no lymphadenopathy, thyroid nonpalpable.  Skin: Warm and dry, no rashes. Cardiac: Regular rate and rhythm, no murmurs rubs or gallops, no lower extremity edema.  Respiratory: Clear to auscultation bilaterally. Not using accessory muscles, speaking in full sentences. Right leg: Large sebaceous cyst on the lateral aspect of the lower leg  Impression and Recommendations:    Left lumbar radiculitis Refilling Flexeril, otherwise doing well.  Sebaceous cyst Return for excision, right lower leg

## 2015-11-05 NOTE — Assessment & Plan Note (Signed)
Return for excision, right lower leg

## 2016-07-02 ENCOUNTER — Telehealth: Payer: Self-pay

## 2016-07-02 NOTE — Telephone Encounter (Signed)
Probably no downtime from work, he will just need to keep it covered and not bump it on anything. If he schedules, and needs to be in a 30 minute slot.

## 2016-07-02 NOTE — Telephone Encounter (Signed)
Pt left VM asking about how long he will he be out after having cyst removed, and what will his restrictions be. Pt was seen by you several months ago and is trying to get procedure done when he has down time with work. Please advise.

## 2016-07-03 NOTE — Telephone Encounter (Signed)
Left VM with information.  

## 2016-07-23 ENCOUNTER — Ambulatory Visit (INDEPENDENT_AMBULATORY_CARE_PROVIDER_SITE_OTHER): Payer: BLUE CROSS/BLUE SHIELD | Admitting: Sports Medicine

## 2016-07-23 DIAGNOSIS — R2241 Localized swelling, mass and lump, right lower limb: Secondary | ICD-10-CM | POA: Diagnosis not present

## 2016-07-23 MED ORDER — HYDROCODONE-ACETAMINOPHEN 5-325 MG PO TABS: 1.0000 | ORAL_TABLET | Freq: Three times a day (TID) | ORAL | 0 refills | Status: DC | PRN

## 2016-07-23 NOTE — Assessment & Plan Note (Signed)
Surgical excision, return in 10 days

## 2016-07-23 NOTE — Progress Notes (Signed)
   Subjective:    I'm seeing this patient as a consultation for:  Dr. Dewain Penningavid Haimes  CC: Right lower leg mass  HPI: For years this pleasant 41 year old male has had a large mass that he localizes on his right lower leg, he has had sebaceous cyst in the past, this is been slowly growing. It's not painful but symptoms are persistent. It has leaked and drained a foul-smelling fluid in the past. No radiation.  Past medical history:  Negative.  See flowsheet/record as well for more information.  Surgical history: Negative.  See flowsheet/record as well for more information.  Family history: Negative.  See flowsheet/record as well for more information.  Social history: Negative.  See flowsheet/record as well for more information.  Allergies, and medications have been entered into the medical record, reviewed, and no changes needed.   Review of Systems: No headache, visual changes, nausea, vomiting, diarrhea, constipation, dizziness, abdominal pain, skin rash, fevers, chills, night sweats, weight loss, swollen lymph nodes, body aches, joint swelling, muscle aches, chest pain, shortness of breath, mood changes, visual or auditory hallucinations.   Objective:   General: Well Developed, well nourished, and in no acute distress.  Neuro/Psych: Alert and oriented x3, extra-ocular muscles intact, able to move all 4 extremities, sensation grossly intact. Skin: Warm and dry, no rashes noted.  Respiratory: Not using accessory muscles, speaking in full sentences, trachea midline.  Cardiovascular: Pulses palpable, no extremity edema. Abdomen: Does not appear distended. Right leg: There is a large, well-defined, movable, soft mass on the right lower leg, lateral aspect.  Procedure:  Excision of  5 cm right lower leg mass Risks, benefits, and alternatives explained and consent obtained. Time out conducted. Surface prepped with alcohol. 10 mL bupivacaine with epinephine infiltrated in a field  block. Adequate anesthesia ensured. Area prepped and draped in a sterile fashion. Excision performed with: I made a linear longitudinal incision over the mass, while gently dissecting I entered the mass and sebaceous fluid was expressed. I evacuated all of the sebum and then using both blunt and sharp dissection I was able to remove the cyst capsule and its entirety. I then placed a single 3-0 running subcuticular Vicryl suture. Hemostasis achieved. Pt stable.  Impression and Recommendations:   This case required medical decision making of moderate complexity.  Mass of lower leg, right Surgical excision, return in 10 days

## 2016-08-03 ENCOUNTER — Ambulatory Visit (INDEPENDENT_AMBULATORY_CARE_PROVIDER_SITE_OTHER): Payer: BLUE CROSS/BLUE SHIELD | Admitting: Sports Medicine

## 2016-08-03 DIAGNOSIS — R2241 Localized swelling, mass and lump, right lower limb: Secondary | ICD-10-CM | POA: Diagnosis not present

## 2016-08-03 DIAGNOSIS — G43109 Migraine with aura, not intractable, without status migrainosus: Secondary | ICD-10-CM | POA: Diagnosis not present

## 2016-08-03 MED ORDER — RIZATRIPTAN BENZOATE 10 MG PO TBDP: 10.0000 mg | ORAL_TABLET | ORAL | 3 refills | Status: AC | PRN

## 2016-08-03 NOTE — Assessment & Plan Note (Addendum)
Calling in Maxalt, he needs a follow this up with his PCP.

## 2016-08-03 NOTE — Assessment & Plan Note (Signed)
Doing well 10 days post excision of a sebaceous cyst.  Return as needed

## 2016-08-03 NOTE — Progress Notes (Signed)
  Subjective:    CC: Follow-up  HPI: I removed a large right lower leg sebaceous cyst 10 days ago, he returns and things are going extremely well.  Migraines: Present for childhood, gets an aura, was previously only having 1 or 2 per year, would like something for abortive treatment. Agrees to follow this up with his PCP.  Past medical history:  Negative.  See flowsheet/record as well for more information.  Surgical history: Negative.  See flowsheet/record as well for more information.  Family history: Negative.  See flowsheet/record as well for more information.  Social history: Negative.  See flowsheet/record as well for more information.  Allergies, and medications have been entered into the medical record, reviewed, and no changes needed.   Review of Systems: No fevers, chills, night sweats, weight loss, chest pain, or shortness of breath.   Objective:    General: Well Developed, well nourished, and in no acute distress.  Neuro: Alert and oriented x3, extra-ocular muscles intact, sensation grossly intact.  HEENT: Normocephalic, atraumatic, pupils equal round reactive to light, neck supple, no masses, no lymphadenopathy, thyroid nonpalpable.  Skin: Warm and dry, no rashes. Cardiac: Regular rate and rhythm, no murmurs rubs or gallops, no lower extremity edema.  Respiratory: Clear to auscultation bilaterally. Not using accessory muscles, speaking in full sentences. Right lower leg: Incision is clean, dry, intact.  Impression and Recommendations:    Mass of lower leg, right Doing well 10 days post excision of a sebaceous cyst.  Return as needed  Migraine headache with aura Calling in Maxalt, he needs a follow this up with his PCP.  I spent 25 minutes with this patient, greater than 50% was face-to-face time counseling regarding the above diagnoses

## 2016-08-13 ENCOUNTER — Telehealth: Payer: Self-pay

## 2016-08-13 IMAGING — CR DG THORACIC SPINE 2V
4 series · 4 of 4 positions shown · non-contrast
Comparison: Abdominal series of May 28, 2009

CLINICAL DATA: Three days of lower thoracic spine pain

EXAM:
THORACIC SPINE - 2 VIEW

[view not recorded (1 of 4)]
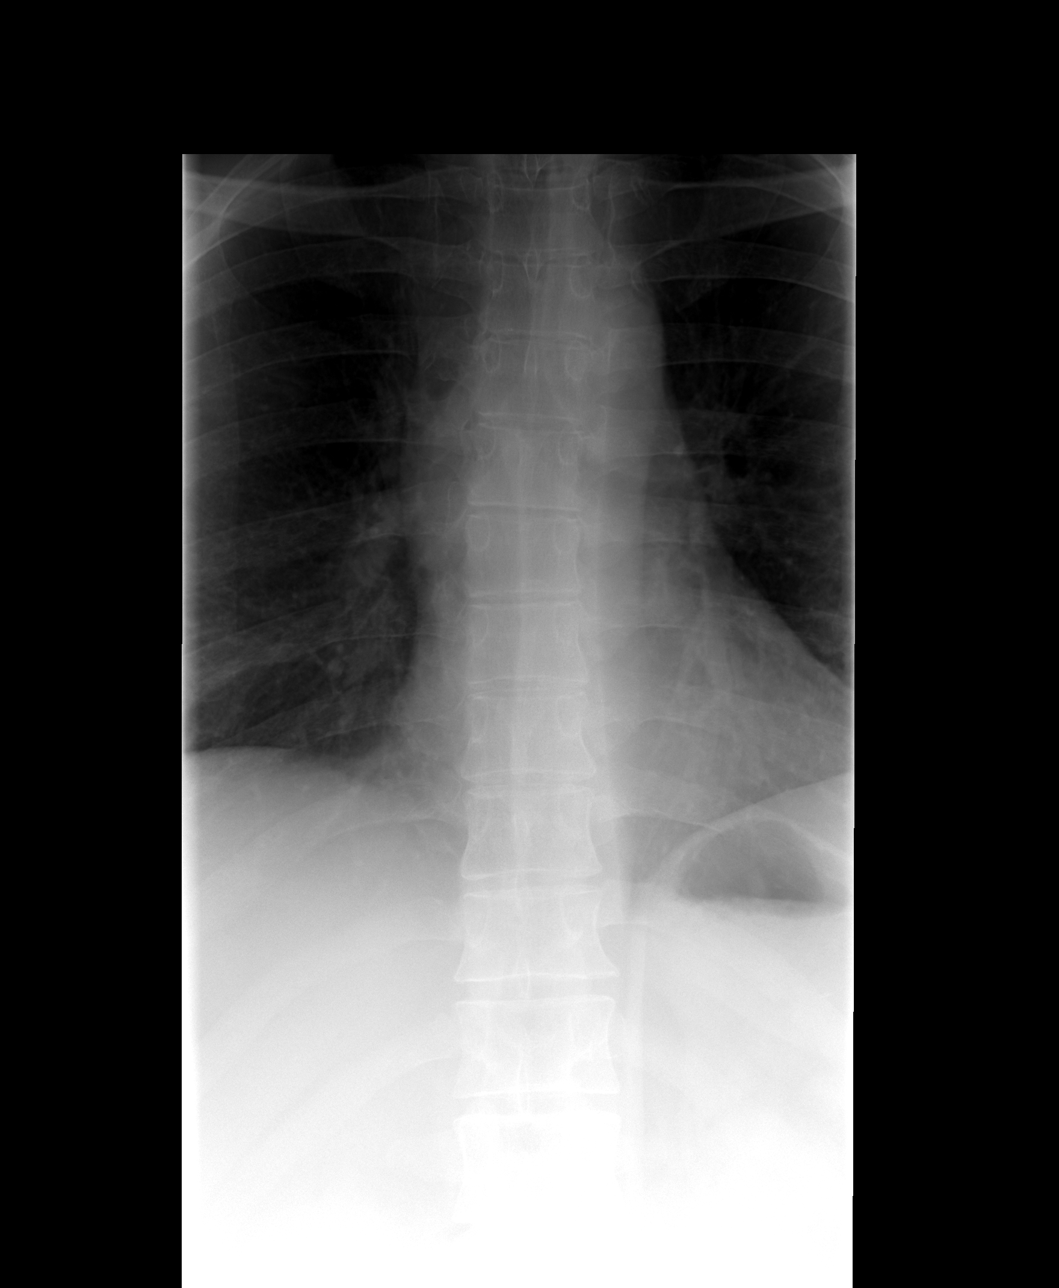

[view not recorded (2 of 4)]
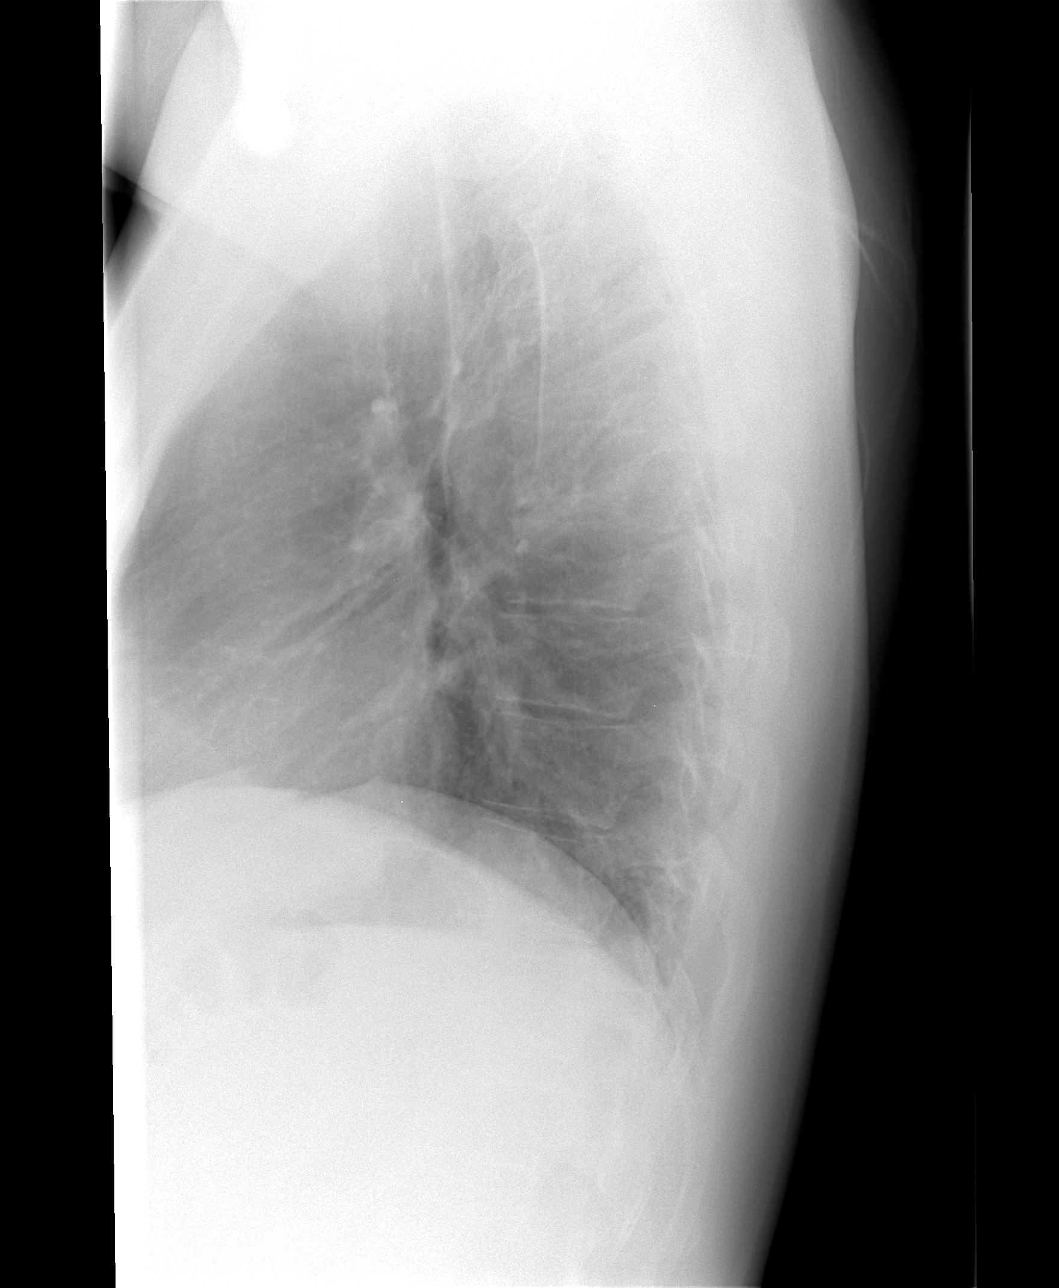

[view not recorded (3 of 4)]
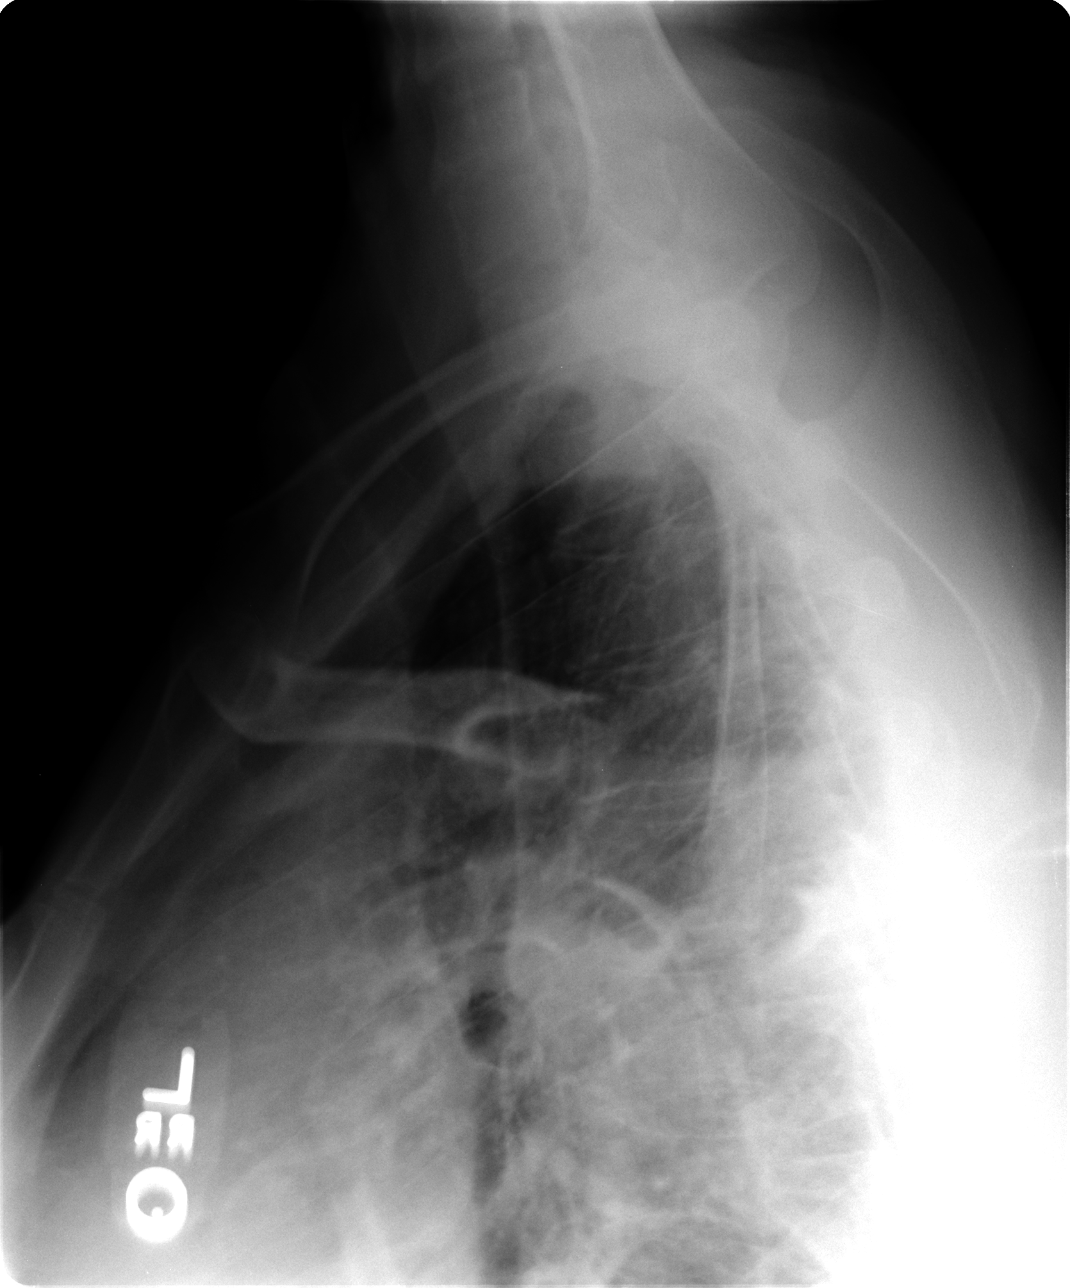

[view not recorded (4 of 4)]
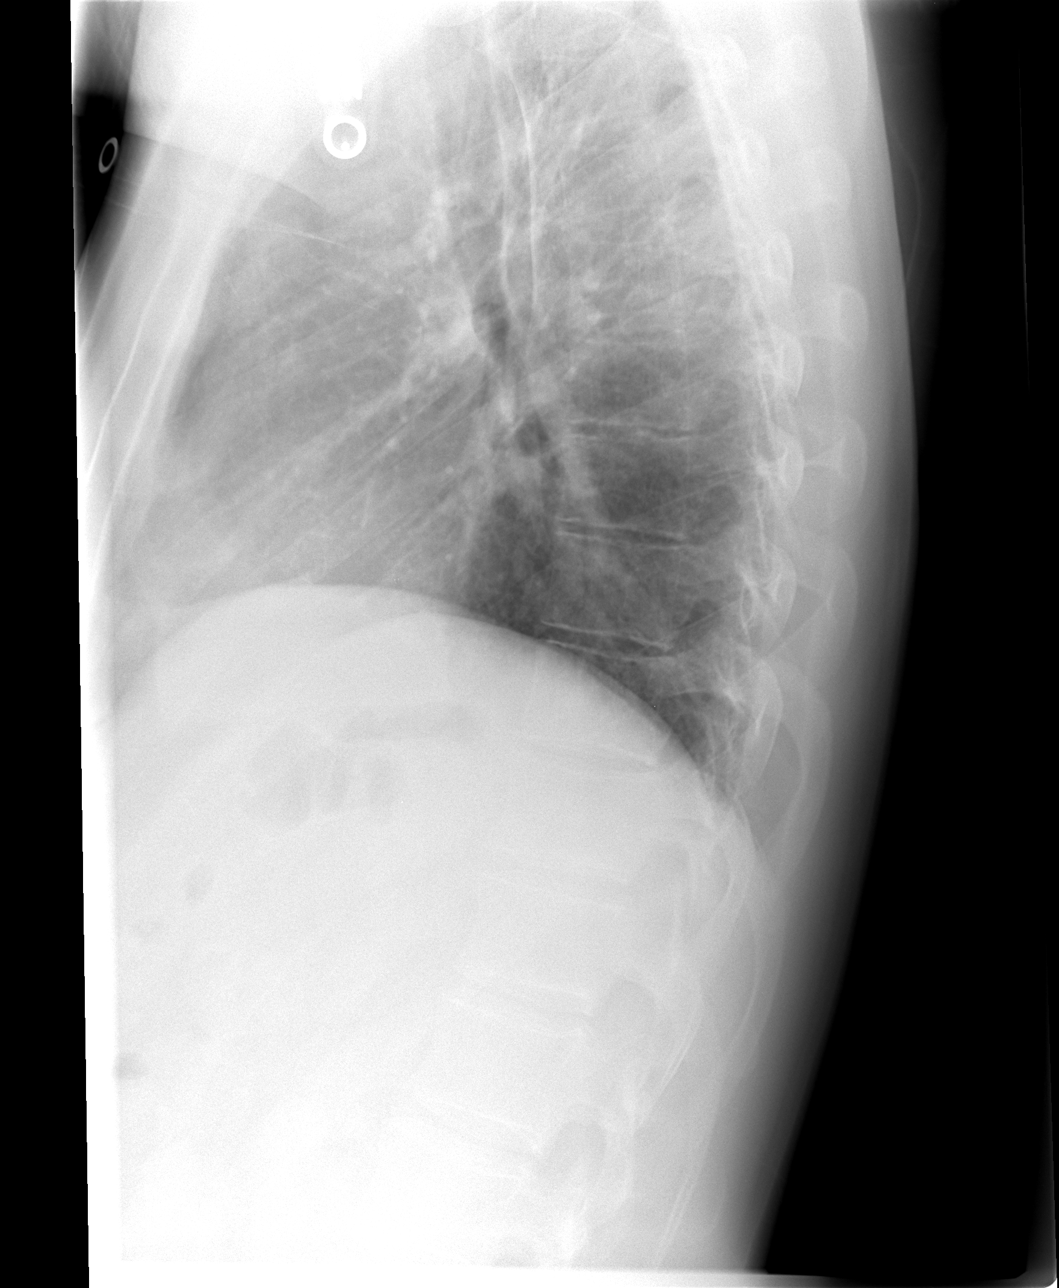

[4 of 4 positions shown; findings below may reference images not displayed]

FINDINGS: The images are technically limited. The thoracic vertebral bodies
are preserved in height. The intervertebral disc spaces exhibit mild
narrowing diffusely where visualized. There is no spondylolisthesis.
There are no abnormal paravertebral soft tissue densities.
IMPRESSION: There is no acute bony abnormality of the lumbar spine. Mild
degenerative disc space narrowing is present at multiple levels.

## 2016-08-13 NOTE — Telephone Encounter (Signed)
You opened the mass on the 10th, pt thinks it may be infected at this point.  Redness around the edges, sore and tender, draining a yellowish fluid no smell.  No fever.  He wants to know if you need to see him or if you can call in something for him.  Please advise.

## 2016-08-13 NOTE — Telephone Encounter (Signed)
Left VM for patient to make appointment tomorrow.

## 2016-08-13 NOTE — Telephone Encounter (Signed)
Ideally he needs to come in so that I can get a sample of the fluid and send it for culture before starting antibiotics. I have a couple openings left tomorrow. He can also see one of my partners for fluid sampling before starting antibiotics as well as to just recheck the wound.

## 2016-08-14 ENCOUNTER — Ambulatory Visit (INDEPENDENT_AMBULATORY_CARE_PROVIDER_SITE_OTHER): Payer: BLUE CROSS/BLUE SHIELD | Admitting: Family Medicine

## 2016-08-14 VITALS — BP 144/83 | HR 84 | Temp 98.0°F | Wt 180.0 lb

## 2016-08-14 DIAGNOSIS — S81802A Unspecified open wound, left lower leg, initial encounter: Secondary | ICD-10-CM

## 2016-08-14 MED ORDER — MUPIROCIN 2 % EX OINT: TOPICAL_OINTMENT | CUTANEOUS | 3 refills | Status: AC

## 2016-08-14 MED ORDER — DOXYCYCLINE HYCLATE 100 MG PO TABS: 100.0000 mg | ORAL_TABLET | Freq: Two times a day (BID) | ORAL | 0 refills | Status: DC

## 2016-08-14 NOTE — Patient Instructions (Signed)
Thank you for coming in today. Keep the wound covered with ointment Take doxy twice daily for 1 week.  Do not take with calcium or iron.  You can take it with a little food.  Make sure to wear sun screen and protect yourself against sun burn on Doxy.   Recheck as needed.     Laceration Care, Adult A laceration is a cut that goes through all of the layers of the skin and into the tissue that is right under the skin. Some lacerations heal on their own. Others need to be closed with stitches (sutures), staples, skin adhesive strips, or skin glue. Proper laceration care minimizes the risk of infection and helps the laceration to heal better. How is this treated? If sutures or staples were used:  Keep the wound clean and dry.  If you were given a bandage (dressing), you should change it at least one time per day or as told by your health care provider. You should also change it if it becomes wet or dirty.  Keep the wound completely dry for the first 24 hours or as told by your health care provider. After that time, you may shower or bathe. However, make sure that the wound is not soaked in water until after the sutures or staples have been removed.  Clean the wound one time each day or as told by your health care provider: ? Wash the wound with soap and water. ? Rinse the wound with water to remove all soap. ? Pat the wound dry with a clean towel. Do not rub the wound.  After cleaning the wound, apply a thin layer of antibiotic ointmentas told by your health care provider. This will help to prevent infection and keep the dressing from sticking to the wound.  Have the sutures or staples removed as told by your health care provider. If skin adhesive strips were used:  Keep the wound clean and dry.  If you were given a bandage (dressing), you should change it at least one time per day or as told by your health care provider. You should also change it if it becomes dirty or wet.  Do not  get the skin adhesive strips wet. You may shower or bathe, but be careful to keep the wound dry.  If the wound gets wet, pat it dry with a clean towel. Do not rub the wound.  Skin adhesive strips fall off on their own. You may trim the strips as the wound heals. Do not remove skin adhesive strips that are still stuck to the wound. They will fall off in time. If skin glue was used:  Try to keep the wound dry, but you may briefly wet it in the shower or bath. Do not soak the wound in water, such as by swimming.  After you have showered or bathed, gently pat the wound dry with a clean towel. Do not rub the wound.  Do not do any activities that will make you sweat heavily until the skin glue has fallen off on its own.  Do not apply liquid, cream, or ointment medicine to the wound while the skin glue is in place. Using those may loosen the film before the wound has healed.  If you were given a bandage (dressing), you should change it at least one time per day or as told by your health care provider. You should also change it if it becomes dirty or wet.  If a dressing is placed over the  wound, be careful not to apply tape directly over the skin glue. Doing that may cause the glue to be pulled off before the wound has healed.  Do not pick at the glue. The skin glue usually remains in place for 5-10 days, then it falls off of the skin. General Instructions  Take over-the-counter and prescription medicines only as told by your health care provider.  If you were prescribed an antibiotic medicine or ointment, take or apply it as told by your doctor. Do not stop using it even if your condition improves.  To help prevent scarring, make sure to cover your wound with sunscreen whenever you are outside after stitches are removed, after adhesive strips are removed, or when glue remains in place and the wound is healed. Make sure to wear a sunscreen of at least 30 SPF.  Do not scratch or pick at the  wound.  Keep all follow-up visits as told by your health care provider. This is important.  Check your wound every day for signs of infection. Watch for: ? Redness, swelling, or pain. ? Fluid, blood, or pus.  Raise (elevate) the injured area above the level of your heart while you are sitting or lying down, if possible. Contact a health care provider if:  You received a tetanus shot and you have swelling, severe pain, redness, or bleeding at the injection site.  You have a fever.  A wound that was closed breaks open.  You notice a bad smell coming from your wound or your dressing.  You notice something coming out of the wound, such as wood or glass.  Your pain is not controlled with medicine.  You have increased redness, swelling, or pain at the site of your wound.  You have fluid, blood, or pus coming from your wound.  You notice a change in the color of your skin near your wound.  You need to change the dressing frequently due to fluid, blood, or pus draining from the wound.  You develop a new rash.  You develop numbness around the wound. Get help right away if:  You develop severe swelling around the wound.  Your pain suddenly increases and is severe.  You develop painful lumps near the wound or on skin that is anywhere on your body.  You have a red streak going away from your wound.  The wound is on your hand or foot and you cannot properly move a finger or toe.  The wound is on your hand or foot and you notice that your fingers or toes look pale or bluish. This information is not intended to replace advice given to you by your health care provider. Make sure you discuss any questions you have with your health care provider. Document Released: 03/02/2005 Document Revised: 08/02/2015 Document Reviewed: 02/26/2014 Elsevier Interactive Patient Education  2017 [employer]

## 2016-08-14 NOTE — Progress Notes (Signed)
James Butler is a 41 y.o. male who [facility] today for follow-up leg wound. Patient was seen by my partner Dr. Benjamin Stainhekkekandam on May 10 for an excision of a large sebaceous cyst on his right lateral lower leg. The wound was closed with subcutaneous sutures. He was seen for recheck on the 21st and was doing well. Since then he's noted mild redness and a small dehiscence of the wound. He denies drainage fevers or chills. He notes minimal tenderness. He is here today for recheck.   Past Medical History:  Diagnosis Date  . GERD (gastroesophageal reflux disease)   . IBS (irritable bowel syndrome)   . Migraine    Past Surgical History:  Procedure Laterality Date  . HERNIA REPAIR    . TONSILLECTOMY    . WRIST SURGERY     R  . WRIST SURGERY     Social History  Substance Use Topics  . Smoking status: Former Smoker    Types: Cigarettes    Quit date: 11/18/1998  . Smokeless tobacco: Not on file  . Alcohol use Yes     Comment: 15-20 q wk   family history includes Cancer in his father and mother; Diabetes in his father.  ROS as above:  Medications: Current Outpatient Prescriptions  Medication Sig Dispense Refill  . Calcium Carbonate-Vitamin D 600-400 MG-UNIT per tablet Take 1 tablet by mouth 2 (two) times daily. 60 tablet 11  . cyclobenzaprine (FLEXERIL) 10 MG tablet Take 1 tablet (10 mg total) by mouth 3 (three) times daily as needed. 90 tablet 11  . HYDROcodone-acetaminophen (NORCO/VICODIN) 5-325 MG tablet Take 1 tablet by mouth every 8 (eight) hours as needed for moderate pain. 20 tablet 0  . pantoprazole (PROTONIX) 40 MG tablet Take 40 mg by mouth daily.    . rizatriptan (MAXALT-MLT) 10 MG disintegrating tablet Take 1 tablet (10 mg total) by mouth as needed for migraine. May repeat in 2 hours if needed 10 tablet 3  . doxycycline (VIBRA-TABS) 100 MG tablet  Take 1 tablet (100 mg total) by mouth 2 (two) times daily. 14 tablet 0  . mupirocin ointment (BACTROBAN) 2 % Apply to affected area TID for 7 days. 30 g 3   No current facility-administered medications for this visit.    Allergies  Allergen Reactions  . Pollen Extract   . Shellfish Allergy     Health Maintenance Health Maintenance  Topic Date Due  . HIV Screening  01/31/1990  . TETANUS/TDAP  01/31/1994  . INFLUENZA VACCINE  10/14/2016     Exam:  BP (!) 144/83   Pulse 84   Temp 98 F (36.7 C) (Oral)   Wt 180 lb (81.6 kg)   BMI 25.83 kg/m  Gen: Well NAD Skin right leg mild dehiscence of the central portion of the wound with an eroding subcutaneous absorbable suture present. No drainage. Mild skin erythema. Not particularly tender. No fluctuance palpated no induration present.     A small portion of the present was a horrible suture was removed and the wound was cleaned with alcohol and the wound was cultured   No results found for this or any previous visit (from the past 72 hour(s)). No results found.    Assessment and Plan: 41 y.o. male with wound dehiscence with eroding absorbable suture. This may be very early infection. Essentially the wound looks pretty good. Plan for treatment with topical mupirocin antibiotic ointment as  well as oral doxycycline. Culture pending. Follow-up with Dr. Benjamin Stain as needed.   Orders Placed This Encounter  Procedures  . Wound culture    Order Specific Question:   Source    Answer:   right leg wound   Meds ordered this encounter  Medications  . doxycycline (VIBRA-TABS) 100 MG tablet    Sig: Take 1 tablet (100 mg total) by mouth 2 (two) times daily.    Dispense:  14 tablet    Refill:  0  . mupirocin ointment (BACTROBAN) 2 %    Sig: Apply to affected area TID for 7 days.    Dispense:  30 g    Refill:  3     Discussed warning signs or symptoms. Please see discharge instructions. Patient expresses understanding.

## 2016-08-18 LAB — WOUND CULTURE

## 2016-08-18 MED ORDER — AMOXICILLIN 500 MG PO CAPS: 1000.0000 mg | ORAL_CAPSULE | Freq: Two times a day (BID) | ORAL | 0 refills | Status: DC

## 2016-08-18 NOTE — Addendum Note (Signed)
Addended by: Rodolph BongOREY, Constantina Laseter S on: 08/18/2016 05:34 AM   Modules accepted: Orders

## 2016-10-22 ENCOUNTER — Encounter: Payer: Self-pay | Admitting: *Deleted

## 2016-10-22 ENCOUNTER — Emergency Department
Admission: EM | Admit: 2016-10-22 | Discharge: 2016-10-22 | Disposition: A | Discharge status: Home / Self Care | Payer: BLUE CROSS/BLUE SHIELD | Source: Home / Self Care | Attending: Family Medicine | Admitting: Family Medicine

## 2016-10-22 ENCOUNTER — Emergency Department (INDEPENDENT_AMBULATORY_CARE_PROVIDER_SITE_OTHER): Payer: BLUE CROSS/BLUE SHIELD

## 2016-10-22 DIAGNOSIS — S63502A Unspecified sprain of left wrist, initial encounter: Secondary | ICD-10-CM | POA: Diagnosis not present

## 2016-10-22 DIAGNOSIS — M25532 Pain in left wrist: Secondary | ICD-10-CM | POA: Diagnosis not present

## 2016-10-22 NOTE — Discharge Instructions (Signed)
Continue wrist splint.  Apply ice pack for 20 to 30 minutes, 3 to 4 times daily  Continue until pain and swelling decrease.  May take Ibuprofen 200mg , 4 tabs every 8 hours with food.   Begin range of motion and stretching exercises as tolerated.

## 2016-10-22 NOTE — ED Provider Notes (Signed)
[facility]    CSN: [id number] Arrival date & time: 10/22/16  1514     History   Chief Complaint Chief Complaint  Patient presents with  . Wrist Pain    HPI James Butler is a 41 y.o. male.   Patient fell yesterday while playing tennis, injuring his left wrist.  He has been wearing a velcro wrist splint.   The history is provided by the patient.  Wrist Pain  This is a new problem. The current episode started yesterday. The problem occurs constantly. The problem has not changed since onset.Exacerbated by: wrist movement. Nothing relieves the symptoms. Treatments tried: wrist splint and ibuprofen. The treatment provided mild relief.    Past Medical History:  Diagnosis Date  . GERD (gastroesophageal reflux disease)   . IBS (irritable bowel syndrome)   . Migraine     Patient Active Problem List   Diagnosis Date Noted  . Migraine headache with aura 08/03/2016  . Mass of lower leg, right 11/05/2015  . Left foot pain 10/25/2014  . Left lumbar radiculitis 02/17/2013  . Cervical radiculitis 01/13/2013    Past Surgical History:  Procedure Laterality Date  . HERNIA REPAIR    . TONSILLECTOMY    . WRIST SURGERY     R  . WRIST SURGERY         Home Medications    Prior to Admission medications   Medication Sig Start Date End Date Taking? Authorizing Provider  cyclobenzaprine (FLEXERIL) 10 MG tablet Take 1 tablet (10 mg total) by mouth 3 (three) times daily as needed. 11/05/15  Yes Monica Bectonhekkekandam, Thomas J, MD  pantoprazole (PROTONIX) 40 MG tablet Take 40 mg by mouth daily.   Yes [provider]  Calcium Carbonate-Vitamin D 600-400 MG-UNIT per tablet Take 1 tablet by mouth 2 (two) times daily. 11/29/14   Monica Bectonhekkekandam, Thomas J, MD  mupirocin ointment (BACTROBAN) 2 % Apply to affected area TID for 7 days. 08/14/16   Rodolph Bongorey, Evan S, MD  rizatriptan (MAXALT-MLT) 10 MG disintegrating tablet Take 1 tablet (10 mg total) by mouth as needed for migraine. May repeat  in 2 hours if needed 08/03/16   Monica Bectonhekkekandam, Thomas J, MD    Family History Family History  Problem Relation Age of Onset  . Cancer Mother        breast  . Cancer Father        esophageal  . Diabetes Father     Social History Social History  Substance Use Topics  . Smoking status: Former Smoker    Types: Cigarettes    Quit date: 11/18/1998  . Smokeless tobacco: Never Used  . Alcohol use Yes     Comment: 15-20 q wk     Allergies   Pollen extract and Shellfish allergy   Review of Systems Review of Systems   Physical Exam Triage Vital Signs ED Triage Vitals [10/22/16 1541]  Enc Vitals Group     BP (!) 163/110     Pulse Rate 79     Resp 16     Temp 98.6 F (37 C)     Temp Source Oral     SpO2 99 %     Weight 177 lb (80.3 kg)     Height 5\' 10"  (1.778 m)     Head Circumference      Peak Flow      Pain Score 3     Pain Loc      Pain Edu?  Excl. in GC?    No data found.   Updated Vital Signs BP (!) 163/110 (BP Location: Left Arm)   Pulse 79   Temp 98.6 F (37 C) (Oral)   Resp 16   Ht 5\' 10"  (1.778 m)   Wt 177 lb (80.3 kg)   SpO2 99%   BMI 25.40 kg/m   Visual Acuity Right Eye Distance:   Left Eye Distance:   Bilateral Distance:    Right Eye Near:   Left Eye Near:    Bilateral Near:     Physical Exam  Constitutional: He appears well-developed and well-nourished. No distress.  HENT:  Head: Normocephalic.  Eyes: Pupils are equal, round, and reactive to light.  Neck: Normal range of motion.  Cardiovascular: Normal rate.   Pulmonary/Chest: Effort normal.  Musculoskeletal:       Left wrist: He exhibits tenderness and bony tenderness. He exhibits normal range of motion, no swelling, no effusion, no crepitus, no deformity and no laceration.       Arms: Left wrist has ecchymosis volar surface with mild tenderness to palpation.  Good range of motion.  No snuffbox tenderness.  Distal neurovascular function is intact.    Neurological: He is alert.   Skin: Skin is warm and dry.  Nursing note and vitals reviewed.    UC Treatments / Results  Labs (all labs ordered are listed, but only abnormal results are displayed) Labs Reviewed - No data to display  EKG  EKG Interpretation None       Radiology Dg Wrist Complete Left  Result Date: 10/22/2016 CLINICAL DATA:  Fall yesterday playing tennis.  Wrist pain EXAM: LEFT WRIST - COMPLETE 3+ VIEW COMPARISON:  08/16/2011 FINDINGS: There is no evidence of fracture or dislocation. There is no evidence of arthropathy or other focal bone abnormality. Soft tissues are unremarkable. IMPRESSION: Negative. Electronically Signed   By: Marlan Palau M.D.   On: 10/22/2016 15:44    Procedures Procedures (including critical care time)  Medications Ordered in UC Medications - No data to display   Initial Impression / Assessment and Plan / UC Course  I have reviewed the triage vital signs and the nursing notes.  Pertinent labs & imaging results that were available during my care of the patient were reviewed by me and considered in my medical decision making (see chart for details).    Continue wrist splint.  Apply ice pack for 20 to 30 minutes, 3 to 4 times daily  Continue until pain and swelling decrease.  May take Ibuprofen 200mg , 4 tabs every 8 hours with food.   Begin range of motion and stretching exercises as tolerated. Followup with Dr. Rodney Langton or Dr. Clementeen Graham ([facility]) if not improving about two weeks.     Final Clinical Impressions(s) / UC Diagnoses   Final diagnoses:  Sprain of left wrist, initial encounter    New Prescriptions New Prescriptions   No medications on file       Lattie Haw, MD 10/22/16 1625

## 2016-10-22 NOTE — ED Triage Notes (Signed)
Pt c/o LT wrist pain post fall yesterday while playing tennis. Last dose IBF at 1300 today.

## 2016-11-04 ENCOUNTER — Ambulatory Visit (INDEPENDENT_AMBULATORY_CARE_PROVIDER_SITE_OTHER): Payer: BLUE CROSS/BLUE SHIELD

## 2016-11-04 ENCOUNTER — Ambulatory Visit (INDEPENDENT_AMBULATORY_CARE_PROVIDER_SITE_OTHER): Payer: BLUE CROSS/BLUE SHIELD | Admitting: Sports Medicine

## 2016-11-04 DIAGNOSIS — S62002A Unspecified fracture of navicular [scaphoid] bone of left wrist, initial encounter for closed fracture: Secondary | ICD-10-CM

## 2016-11-04 DIAGNOSIS — M5416 Radiculopathy, lumbar region: Secondary | ICD-10-CM | POA: Diagnosis not present

## 2016-11-04 DIAGNOSIS — W19XXXA Unspecified fall, initial encounter: Secondary | ICD-10-CM

## 2016-11-04 DIAGNOSIS — S62032A Displaced fracture of proximal third of navicular [scaphoid] bone of left wrist, initial encounter for closed fracture: Secondary | ICD-10-CM

## 2016-11-04 DIAGNOSIS — S6992XA Unspecified injury of left wrist, hand and finger(s), initial encounter: Secondary | ICD-10-CM

## 2016-11-04 MED ORDER — MELOXICAM 15 MG PO TABS: ORAL_TABLET | ORAL | 3 refills | Status: AC

## 2016-11-04 MED ORDER — CYCLOBENZAPRINE HCL 10 MG PO TABS: 10.0000 mg | ORAL_TABLET | Freq: Three times a day (TID) | ORAL | 11 refills | Status: AC | PRN

## 2016-11-04 NOTE — Assessment & Plan Note (Addendum)
A little bit of pain at the snuffbox after a fall 2 weeks ago, swelling, overall getting better. Repeating x-rays, initial x-rays were negative per patient. Sometimes these fractures take a couple of weeks to show up. He will wear his Velcro brace day and night, and simply wrap it with an Ace bandage when playing golf, adding meloxicam, rehabilitation exercises.  I think if there is a scaphoid fracture we'll seen on x-rays today. Return in 3 weeks.  X-rays do confirm a questionable fracture through the proximal scaphoid, transverse, I am going to get a CT for confirmation.

## 2016-11-04 NOTE — Addendum Note (Signed)
Addended by: Monica Becton on: 11/04/2016 12:20 PM   Modules accepted: Orders

## 2016-11-04 NOTE — Progress Notes (Signed)
   Subjective:    I'm seeing this patient as a consultation for: Dr. Dewain Penning   CC: Left wrist injury  HPI: This is a pleasant 41 year old male, 2 weeks ago he fell onto an out stretched left hand, he had immediate pain, swelling, bruising without deformity. He had x-rays done initially that were negative, he has been intermittently wearing a Velcro wrist brace without thumb. Pain is moderate, persistent, localized over the dorsal radiocarpal joint, and a bit in the anatomical snuffbox. Overall symptoms are heading in the right direction.  Past medical history, Surgical history, Family history not pertinant except as noted below, Social history, Allergies, and medications have been entered into the medical record, reviewed, and no changes needed.   Review of Systems: No headache, visual changes, nausea, vomiting, diarrhea, constipation, dizziness, abdominal pain, skin rash, fevers, chills, night sweats, weight loss, swollen lymph nodes, body aches, joint swelling, muscle aches, chest pain, shortness of breath, mood changes, visual or auditory hallucinations.   Objective:   General: Well Developed, well nourished, and in no acute distress.  Neuro:  Extra-ocular muscles intact, able to move all 4 extremities, sensation grossly intact.  Deep tendon reflexes tested were normal. Psych: Alert and oriented, mood congruent with affect. ENT:  Ears and nose appear unremarkable.  Hearing grossly normal. Neck: Unremarkable overall appearance, trachea midline.  No visible thyroid enlargement. Eyes: Conjunctivae and lids appear unremarkable.  Pupils equal and round. Skin: Warm and dry, no rashes noted.  Cardiovascular: Pulses palpable, no extremity edema. Left Wrist: Slightly swollen ROM smooth and normal with good flexion and extension and ulnar/radial deviation that is symmetrical with opposite wrist. Palpation is normal over metacarpals, navicular, lunate, and TFCC; tendons without tenderness/  swelling Mild snuffbox pain No tenderness over Canal of Guyon. Strength 5/5 in all directions without pain. Negative tinel's and phalens signs. Negative Finkelstein sign. Negative Watson's test.  X-rays reviewed, there is a fracture through the proximal scaphoid, slightly displaced.  Impression and Recommendations:   This case required medical decision making of moderate complexity.  Occult closed fracture of scaphoid, left, initial encounter A little bit of pain at the snuffbox after a fall 2 weeks ago, swelling, overall getting better. Repeating x-rays, initial x-rays were negative per patient. Sometimes these fractures take a couple of weeks to show up. He will wear his Velcro brace day and night, and simply wrap it with an Ace bandage when playing golf, adding meloxicam, rehabilitation exercises.  I think if there is a scaphoid fracture we'll seen on x-rays today. Return in 3 weeks.  X-rays do confirm a questionable fracture through the proximal scaphoid, transverse, I am going to get a CT for confirmation.

## 2016-11-12 ENCOUNTER — Encounter (HOSPITAL_COMMUNITY): Payer: Self-pay | Admitting: *Deleted

## 2016-11-12 NOTE — Progress Notes (Signed)
James JenkinsJan Coltrain, R.N. Called Dr. Carlos LeveringGramig's office for orders.

## 2016-11-14 ENCOUNTER — Ambulatory Visit (HOSPITAL_COMMUNITY): Payer: BLUE CROSS/BLUE SHIELD | Admitting: Anesthesiology

## 2016-11-14 ENCOUNTER — Encounter (HOSPITAL_COMMUNITY): Payer: Self-pay | Admitting: *Deleted

## 2016-11-14 ENCOUNTER — Ambulatory Visit (HOSPITAL_COMMUNITY)
Admission: RE | Admit: 2016-11-14 | Discharge: 2016-11-14 | Disposition: A | Discharge status: Home / Self Care | Payer: BLUE CROSS/BLUE SHIELD | Source: Ambulatory Visit | Attending: Orthopedic Surgery | Admitting: Orthopedic Surgery

## 2016-11-14 ENCOUNTER — Encounter (HOSPITAL_COMMUNITY)
Admission: RE | Disposition: A | Discharge status: Home / Self Care | Payer: Self-pay | Source: Ambulatory Visit | Attending: Orthopedic Surgery

## 2016-11-14 DIAGNOSIS — Z79899 Other long term (current) drug therapy: Secondary | ICD-10-CM | POA: Diagnosis not present

## 2016-11-14 DIAGNOSIS — X58XXXA Exposure to other specified factors, initial encounter: Secondary | ICD-10-CM | POA: Insufficient documentation

## 2016-11-14 DIAGNOSIS — M47812 Spondylosis without myelopathy or radiculopathy, cervical region: Secondary | ICD-10-CM | POA: Insufficient documentation

## 2016-11-14 DIAGNOSIS — S62032A Displaced fracture of proximal third of navicular [scaphoid] bone of left wrist, initial encounter for closed fracture: Secondary | ICD-10-CM | POA: Insufficient documentation

## 2016-11-14 DIAGNOSIS — Z87891 Personal history of nicotine dependence: Secondary | ICD-10-CM | POA: Diagnosis not present

## 2016-11-14 DIAGNOSIS — K589 Irritable bowel syndrome without diarrhea: Secondary | ICD-10-CM | POA: Diagnosis not present

## 2016-11-14 DIAGNOSIS — M19041 Primary osteoarthritis, right hand: Secondary | ICD-10-CM | POA: Insufficient documentation

## 2016-11-14 DIAGNOSIS — Z888 Allergy status to other drugs, medicaments and biological substances status: Secondary | ICD-10-CM | POA: Insufficient documentation

## 2016-11-14 DIAGNOSIS — F329 Major depressive disorder, single episode, unspecified: Secondary | ICD-10-CM | POA: Diagnosis not present

## 2016-11-14 DIAGNOSIS — Z91013 Allergy to seafood: Secondary | ICD-10-CM | POA: Insufficient documentation

## 2016-11-14 DIAGNOSIS — K219 Gastro-esophageal reflux disease without esophagitis: Secondary | ICD-10-CM | POA: Diagnosis not present

## 2016-11-14 HISTORY — DX: Unspecified asthma, uncomplicated: J45.909

## 2016-11-14 HISTORY — DX: Depression, unspecified: F32.A

## 2016-11-14 HISTORY — PX: ORIF SCAPHOID FRACTURE: SHX2130

## 2016-11-14 HISTORY — DX: Major depressive disorder, single episode, unspecified: F32.9

## 2016-11-14 HISTORY — DX: Unspecified osteoarthritis, unspecified site: M19.90

## 2016-11-14 HISTORY — DX: Cardiac murmur, unspecified: R01.1

## 2016-11-14 SURGERY — OPEN REDUCTION INTERNAL FIXATION (ORIF) SCAPHOID FRACTURE
Anesthesia: General | Site: Hand | Laterality: Left

## 2016-11-14 MED ORDER — LIDOCAINE HCL (CARDIAC) 20 MG/ML IV SOLN
INTRAVENOUS | Status: DC | PRN
  Administered 2016-11-14: 80 mg via INTRAVENOUS

## 2016-11-14 MED ORDER — ONDANSETRON HCL 4 MG/2ML IJ SOLN
INTRAMUSCULAR | Status: DC | PRN
  Administered 2016-11-14: 4 mg via INTRAVENOUS

## 2016-11-14 MED ORDER — MIDAZOLAM HCL 5 MG/5ML IJ SOLN
INTRAMUSCULAR | Status: DC | PRN
  Administered 2016-11-14: 2 mg via INTRAVENOUS

## 2016-11-14 MED ORDER — LACTATED RINGERS IV SOLN
INTRAVENOUS | Status: DC
  Administered 2016-11-14 (×2): via INTRAVENOUS

## 2016-11-14 MED ORDER — SUGAMMADEX SODIUM 200 MG/2ML IV SOLN
INTRAVENOUS | Status: DC | PRN
  Administered 2016-11-14: 161.2 mg via INTRAVENOUS

## 2016-11-14 MED ORDER — FENTANYL CITRATE (PF) 100 MCG/2ML IJ SOLN
INTRAMUSCULAR | Status: DC | PRN
  Administered 2016-11-14 (×4): 50 ug via INTRAVENOUS

## 2016-11-14 MED ORDER — CEFAZOLIN SODIUM-DEXTROSE 2-4 GM/100ML-% IV SOLN
INTRAVENOUS | Status: AC
  Filled 2016-11-14: qty 100

## 2016-11-14 MED ORDER — FENTANYL CITRATE (PF) 100 MCG/2ML IJ SOLN
25.0000 ug | INTRAMUSCULAR | Status: DC | PRN
  Administered 2016-11-14: 25 ug via INTRAVENOUS

## 2016-11-14 MED ORDER — 0.9 % SODIUM CHLORIDE (POUR BTL) OPTIME
TOPICAL | Status: DC | PRN
  Administered 2016-11-14: 1000 mL

## 2016-11-14 MED ORDER — CHLORHEXIDINE GLUCONATE 4 % EX LIQD: 60.0000 mL | Freq: Once | CUTANEOUS | Status: DC

## 2016-11-14 MED ORDER — PROPOFOL 10 MG/ML IV BOLUS
INTRAVENOUS | Status: DC | PRN
  Administered 2016-11-14: 200 mg via INTRAVENOUS

## 2016-11-14 MED ORDER — FENTANYL CITRATE (PF) 250 MCG/5ML IJ SOLN
INTRAMUSCULAR | Status: AC
  Filled 2016-11-14: qty 5

## 2016-11-14 MED ORDER — PROMETHAZINE HCL 25 MG/ML IJ SOLN: 6.2500 mg | INTRAMUSCULAR | Status: DC | PRN

## 2016-11-14 MED ORDER — MIDAZOLAM HCL 2 MG/2ML IJ SOLN
INTRAMUSCULAR | Status: AC
  Filled 2016-11-14: qty 2

## 2016-11-14 MED ORDER — PROPOFOL 10 MG/ML IV BOLUS
INTRAVENOUS | Status: AC
  Filled 2016-11-14: qty 20

## 2016-11-14 MED ORDER — DEXAMETHASONE SODIUM PHOSPHATE 4 MG/ML IJ SOLN
INTRAMUSCULAR | Status: DC | PRN
  Administered 2016-11-14: 10 mg via INTRAVENOUS

## 2016-11-14 MED ORDER — FENTANYL CITRATE (PF) 100 MCG/2ML IJ SOLN
INTRAMUSCULAR | Status: AC
  Filled 2016-11-14: qty 2

## 2016-11-14 MED ORDER — CEFAZOLIN SODIUM-DEXTROSE 2-4 GM/100ML-% IV SOLN
2.0000 g | INTRAVENOUS | Status: AC
  Administered 2016-11-14: 2 g via INTRAVENOUS

## 2016-11-14 SURGICAL SUPPLY — 51 items
BANDAGE ACE 3X5.8 VEL STRL LF (GAUZE/BANDAGES/DRESSINGS) ×1 IMPLANT
BIT DRILL ACUTRAK MICRO 2 (BIT) IMPLANT
BNDG CMPR 9X4 STRL LF SNTH (GAUZE/BANDAGES/DRESSINGS) ×1
BNDG ESMARK 4X9 LF (GAUZE/BANDAGES/DRESSINGS) ×3 IMPLANT
BNDG GAUZE ELAST 4 BULKY (GAUZE/BANDAGES/DRESSINGS) ×3 IMPLANT
CAP PIN ORTHO PINK (CAP) IMPLANT
CORDS BIPOLAR (ELECTRODE) ×3 IMPLANT
COVER SURGICAL LIGHT HANDLE (MISCELLANEOUS) ×3 IMPLANT
CUFF TOURNIQUET SINGLE 18IN (TOURNIQUET CUFF) ×3 IMPLANT
CUFF TOURNIQUET SINGLE 24IN (TOURNIQUET CUFF) IMPLANT
DRAPE OEC MINIVIEW 54X84 (DRAPES) ×2 IMPLANT
DRILL ACUTRAK MICRO 2 (BIT) ×3
DRSG EMULSION OIL 3X3 NADH (GAUZE/BANDAGES/DRESSINGS) ×3 IMPLANT
GAUZE SPONGE 4X4 12PLY STRL (GAUZE/BANDAGES/DRESSINGS) ×3 IMPLANT
GAUZE XEROFORM 1X8 LF (GAUZE/BANDAGES/DRESSINGS) ×2 IMPLANT
GLOVE BIOGEL M 8.0 STRL (GLOVE) ×7 IMPLANT
GLOVE SS BIOGEL STRL SZ 8 (GLOVE) ×2 IMPLANT
GLOVE SUPERSENSE BIOGEL SZ 8 (GLOVE) ×4
GOWN STRL REUS W/ TWL LRG LVL3 (GOWN DISPOSABLE) ×3 IMPLANT
GOWN STRL REUS W/ TWL XL LVL3 (GOWN DISPOSABLE) ×2 IMPLANT
GOWN STRL REUS W/TWL LRG LVL3 (GOWN DISPOSABLE) ×6
GOWN STRL REUS W/TWL XL LVL3 (GOWN DISPOSABLE) ×6
GUIDEWIRE ORTHO MICROSHT  ACUT (WIRE) ×2
GUIDEWIRE ORTHO MICROSHT .035 (WIRE) IMPLANT
KIT BASIN OR (CUSTOM PROCEDURE TRAY) ×3 IMPLANT
KIT ROOM TURNOVER OR (KITS) ×3 IMPLANT
NDL HYPO 25GX1X1/2 BEV (NEEDLE) ×1 IMPLANT
NEEDLE HYPO 25GX1X1/2 BEV (NEEDLE) IMPLANT
NS IRRIG 1000ML POUR BTL (IV SOLUTION) ×3 IMPLANT
PACK ORTHO EXTREMITY (CUSTOM PROCEDURE TRAY) ×3 IMPLANT
PAD ARMBOARD 7.5X6 YLW CONV (MISCELLANEOUS) ×6 IMPLANT
PAD CAST 3X4 CTTN HI CHSV (CAST SUPPLIES) ×1 IMPLANT
PAD CAST 4YDX4 CTTN HI CHSV (CAST SUPPLIES) IMPLANT
PADDING CAST COTTON 3X4 STRL (CAST SUPPLIES) ×3
PADDING CAST COTTON 4X4 STRL (CAST SUPPLIES) ×3
SCOTCHCAST PLUS 2X4 WHITE (CAST SUPPLIES) ×2 IMPLANT
SCREW ACUTRAK 2 MICRO 18MM (Screw) ×2 IMPLANT
SCRUB BETADINE 4OZ XXX (MISCELLANEOUS) ×1 IMPLANT
SOL PREP POV-IOD 4OZ 10% (MISCELLANEOUS) ×1 IMPLANT
SUCTION FRAZIER HANDLE 10FR (MISCELLANEOUS) ×2
SUCTION TUBE FRAZIER 10FR DISP (MISCELLANEOUS) IMPLANT
SUT PROLENE 4 0 PS 2 18 (SUTURE) ×3 IMPLANT
SUT VIC AB 3-0 FS2 27 (SUTURE) IMPLANT
SUT VICRYL 4-0 PS2 18IN ABS (SUTURE) ×2 IMPLANT
SYR CONTROL 10ML LL (SYRINGE) ×3 IMPLANT
TOWEL OR 17X24 6PK STRL BLUE (TOWEL DISPOSABLE) ×3 IMPLANT
TOWEL OR 17X26 10 PK STRL BLUE (TOWEL DISPOSABLE) ×3 IMPLANT
TUBE CONNECTING 12'X1/4 (SUCTIONS) ×1
TUBE CONNECTING 12X1/4 (SUCTIONS) ×1 IMPLANT
UNDERPAD 30X30 (UNDERPADS AND DIAPERS) ×3 IMPLANT
WATER STERILE IRR 1000ML POUR (IV SOLUTION) ×3 IMPLANT

## 2016-11-14 NOTE — Transfer of Care (Signed)
Immediate Anesthesia Transfer of Care Note  Patient: James Butler  Procedure(s) Performed: Procedure(s) with comments: OPEN REDUCTION INTERNAL FIXATION (ORIF) SCAPHOID FRACTURE (Left) - 90 mins  Patient Location: PACU  Anesthesia Type:General  Level of Consciousness: awake, alert  and oriented  Airway & Oxygen Therapy: Patient Spontanous Breathing  Post-op Assessment: Report given to RN and Post -op Vital signs reviewed and stable  Post vital signs: Reviewed and stable  Last Vitals:  Vitals:   11/14/16 1053  BP: (!) 152/110  Pulse: 96  Resp: 18  Temp: 37.1 C  SpO2: 100%    Last Pain:  Vitals:   11/14/16 1111  TempSrc:   PainSc: 2       Patients Stated Pain Goal: 5 (11/14/16 1111)  Complications: No apparent anesthesia complications

## 2016-11-14 NOTE — Anesthesia Postprocedure Evaluation (Signed)
Anesthesia Post Note  Patient: Wyatt Portelaric D Fryberger  Procedure(s) Performed: Procedure(s) (LRB): OPEN REDUCTION INTERNAL FIXATION (ORIF) SCAPHOID FRACTURE (Left)     Patient location during evaluation: PACU Anesthesia Type: General Level of consciousness: awake and alert Pain management: pain level controlled Vital Signs Assessment: post-procedure vital signs reviewed and stable Respiratory status: spontaneous breathing, nonlabored ventilation, respiratory function stable and patient connected to nasal cannula oxygen Cardiovascular status: blood pressure returned to baseline and stable Postop Assessment: no signs of nausea or vomiting Anesthetic complications: no    Last Vitals:  Vitals:   11/14/16 1435 11/14/16 1450  BP: (!) 139/101 (!) 136/101  Pulse: 91 73  Resp: 18 14  Temp:  36.6 C  SpO2: 100% 97%    Last Pain:  Vitals:   11/14/16 1450  TempSrc:   PainSc: 4                  Beryle Lathehomas E Brock

## 2016-11-14 NOTE — OR Nursing (Signed)
Patient denied any known drug allergies pt was prepped with [facility] then  Betadine scrub which was Dr. Carlos LeveringGramig's perfered prepping solution patient has noted shellfish allergy that states anaphylaxis prep started by Arlys JohnBrian with betadine and was immediately discontinued.Skin intact Betadine toweled off extremity flushed clean with saline re-prepped with HCG. 1010 drape was replaced. Skin with out any issues noted.

## 2016-11-14 NOTE — Anesthesia Preprocedure Evaluation (Addendum)
Anesthesia Evaluation  Patient identified by MRN, date of birth, ID band Patient awake    Reviewed: Allergy & Precautions, NPO status , Patient's Chart, lab work & pertinent test results  Airway Mallampati: III  TM Distance: <3 FB Neck ROM: Full    Dental  (+) Dental Advisory Given   Pulmonary asthma , former smoker,    Pulmonary exam normal breath sounds clear to auscultation       Cardiovascular negative cardio ROS Normal cardiovascular exam Rhythm:Regular Rate:Normal     Neuro/Psych  Headaches, PSYCHIATRIC DISORDERS Depression Lumbar and cervical radiculitis    GI/Hepatic Neg liver ROS, GERD  Medicated and Controlled,IBS   Endo/Other  negative endocrine ROS  Renal/GU negative Renal ROS  negative genitourinary   Musculoskeletal  (+) Arthritis ,   Abdominal   Peds  Hematology negative hematology ROS (+)   Anesthesia Other Findings Large incisors  Reproductive/Obstetrics                            Anesthesia Physical Anesthesia Plan  ASA: II  Anesthesia Plan: General   Post-op Pain Management:    Induction: Intravenous  PONV Risk Score and Plan: 3 and Ondansetron, Dexamethasone, Midazolam, Propofol infusion and Treatment may vary due to age or medical condition  Airway Management Planned: Oral ETT  Additional Equipment:   Intra-op Plan:   Post-operative Plan: Extubation in OR  Informed Consent: I have reviewed the patients History and Physical, chart, labs and discussed the procedure including the risks, benefits and alternatives for the proposed anesthesia with the patient or authorized representative who has indicated his/her understanding and acceptance.   Dental advisory given  Plan Discussed with: CRNA  Anesthesia Plan Comments:         Anesthesia Quick Evaluation

## 2016-11-14 NOTE — H&P (Signed)
James Butler is an 41 y.o. male.   Chief Complaint: Patient presents for ORIF left scaphoid fracture proximal pole HPI: Patient presented for ORIF left scaphoid. He has a proximal pole scaphoid fracture. He understands risks and benefits. He understands difficulties healing this fracture. I discussed him all issues at great length.  He notes no other complaints today.  All questions have been encouraged and answered. We'll plan for open reduction internal fixation left scaphoid fracture  Past Medical History:  Diagnosis Date  . Arthritis    neck, right hand  . Asthma    1 asthma attack 8 yrs. old, no problems now  . Depression   . GERD (gastroesophageal reflux disease)   . Heart murmur    as a child, no evidence now  . IBS (irritable bowel syndrome)   . Migraine     Past Surgical History:  Procedure Laterality Date  . COLONOSCOPY    . HERNIA REPAIR    . TONSILLECTOMY    . WRIST SURGERY     R  . WRIST SURGERY      Family History  Problem Relation Age of Onset  . Cancer Mother        breast  . Cancer Father        esophageal  . Diabetes Father    Social History:  reports that he quit smoking about 18 years ago. His smoking use included Cigarettes. He has never used smokeless tobacco. He reports that he drinks alcohol. He reports that he does not use drugs.  Allergies:  Allergies  Allergen Reactions  . Shellfish Allergy Anaphylaxis  . Pollen Extract     Medications Prior to Admission  Medication Sig Dispense Refill  . cyclobenzaprine (FLEXERIL) 10 MG tablet Take 1 tablet (10 mg total) by mouth 3 (three) times daily as needed. (Patient taking differently: Take 10 mg by mouth 3 (three) times daily as needed for muscle spasms. ) 90 tablet 11  . fexofenadine (ALLEGRA) 180 MG tablet Take 180 mg by mouth daily as needed for allergies or rhinitis.    Marland Kitchen ibuprofen (ADVIL,MOTRIN) 200 MG tablet Take 400 mg by mouth every 4 (four) hours as needed for moderate pain.    .  meloxicam (MOBIC) 15 MG tablet One tab PO qAM with breakfast for 2 weeks, then daily prn pain. (Patient taking differently: Take 15 mg by mouth daily as needed for pain. ) 30 tablet 3  . pantoprazole (PROTONIX) 40 MG tablet Take 40 mg by mouth daily.    . rizatriptan (MAXALT-MLT) 10 MG disintegrating tablet Take 1 tablet (10 mg total) by mouth as needed for migraine. May repeat in 2 hours if needed 10 tablet 3  . calcium elemental as carbonate (TUMS ULTRA 1000) 400 MG chewable tablet Chew 1,000 mg by mouth daily as needed for heartburn.    . mupirocin ointment (BACTROBAN) 2 % Apply to affected area TID for 7 days. (Patient not taking: Reported on 11/11/2016) 30 g 3    No results found for this or any previous visit (from the past 48 hour(s)). No results found.  Review of Systems  Respiratory: Negative.   Cardiovascular: Negative.   Gastrointestinal: Negative.   Genitourinary: Negative.     Blood pressure (!) 152/110, pulse 96, temperature 98.8 F (37.1 C), temperature source Oral, resp. rate 18, height 5\' 10"  (1.778 m), weight 80.6 kg (177 lb 11.2 oz), SpO2 100 %. Physical Exam painful left wrist secondary to left scaphoid fracture acute there is  mild swelling no evidence of infection dystrophy or vascular compromise. He stable ligamentous examination.  The patient is alert and oriented in no acute distress. The patient complains of pain in the affected upper extremity.  The patient is noted to have a normal HEENT exam. Lung fields show equal chest expansion and no shortness of breath. Abdomen exam is nontender without distention. Lower extremity examination does not show any fracture dislocation or blood clot symptoms. Pelvis is stable and the neck and back are stable and nontender.  Assessment/Plan Left scaphoid fracture-we will plan for reconstruction is necessary with ORIF.  I discussed him risk and benefits these don'ts and all questions have been encouraged and answered and  addressed We are planning surgery for your upper extremity. The risk and benefits of surgery to include risk of bleeding, infection, anesthesia,  damage to normal structures and failure of the surgery to accomplish its intended goals of relieving symptoms and restoring function have been discussed in detail. With this in mind we plan to proceed. I have specifically discussed with the patient the pre-and postoperative regime and the dos and don'ts and risk and benefits in great detail. Risk and benefits of surgery also include risk of dystrophy(CRPS), chronic nerve pain, failure of the healing process to go onto completion and other inherent risks of surgery The relavent the pathophysiology of the disease/injury process, as well as the alternatives for treatment and postoperative course of action has been discussed in great detail with the patient who desires to proceed.  We will do everything in our power to help you (the patient) restore function to the upper extremity. It is a pleasure to see this patient today.  Karen ChafeGRAMIG III,Nicola Heinemann M, MD 11/14/2016, 12:17 PM

## 2016-11-14 NOTE — Op Note (Signed)
dictation [id number]  Status post ORIF left proximal pole scaphoid fracture  Dot Splinter M.D.

## 2016-11-14 NOTE — Anesthesia Procedure Notes (Signed)
Procedure Name: Intubation Date/Time: 11/14/2016 12:52 PM Performed by: Clearnce Sorrel Pre-anesthesia Checklist: Patient identified, Emergency Drugs available, Suction available, Patient being monitored and Timeout performed Patient Re-evaluated:Patient Re-evaluated prior to induction Oxygen Delivery Method: Circle system utilized Preoxygenation: Pre-oxygenation with 100% oxygen Induction Type: IV induction Ventilation: Mask ventilation without difficulty and Two handed mask ventilation required Laryngoscope Size: Mac and 3 Grade View: Grade II Tube type: Oral Tube size: 7.5 mm Number of attempts: 1 Airway Equipment and Method: Stylet Placement Confirmation: CO2 detector and breath sounds checked- equal and bilateral Secured at: 23 cm Tube secured with: Tape Dental Injury: Teeth and Oropharynx as per pre-operative assessment

## 2016-11-14 NOTE — Discharge Instructions (Signed)

## 2016-11-16 ENCOUNTER — Encounter (HOSPITAL_COMMUNITY): Payer: Self-pay | Admitting: Orthopedic Surgery

## 2016-11-16 NOTE — Op Note (Signed)
NAME:  [name], James Butler                       ACCOUNT NO.:  MEDICAL RECORD NO.:  [id number]  LOCATION:                                 FACILITY:  PHYSICIAN:  Dionne AnoWilliam M. Jordyan Hardiman, M.D.     DATE OF BIRTH:  DATE OF PROCEDURE: DATE OF DISCHARGE:                              OPERATIVE REPORT   PREOPERATIVE DIAGNOSIS:  Proximal pole scaphoid fracture, left wrist.  POSTOPERATIVE DIAGNOSIS:  Proximal pole scaphoid fracture, left wrist.  PROCEDURES: 1. Open reduction internal fixation, scaphoid fracture, left wrist,     proximal pole with a Micro Acutrak screw. 2. AP, lateral, and oblique x-rays performed, examined, and     interpreted by myself.  SURGEON:  Dionne AnoWilliam M. Amanda PeaGramig, MD.  ASSIST:  Karie ChimeraBrian Buchanan, P.A.-C.  COMPLICATIONS:  None.  ANESTHESIA:  General.  TOURNIQUET TIME:  Less than 30 minutes.  INDICATIONS:  A 41 year old male presents with a proximal pole scaphoid fracture.  Given the precarious blood flow to the proximal pole of the scaphoid, the mild displacement, and the findings would recommend ORIF. The patient concurs.  OPERATIVE PROCEDURE:  The patient was seen by myself and Anesthesia, taken to the operative theater, underwent smooth induction of general anesthetic.  He was prepped with Hibiclens scrub, followed by sterile draping.  Time-out was observed.  Preoperative Ancef was given and the patient had a small incision made just distal to Lister's tubercle over the 3-4 region of the wrist.  Dissection was carried down.  I very carefully retracted the interval between the fourth and third dorsal compartment and following this, incised the capsule, taking great care to avoid injury to any interosseous ligaments.  Once the capsule was incised, I decompressed inflammatory fluid in the joint and then noted the proximal pole.  I positioned the scaphoid so as to accept the Micro Acutrak wire.  This was done without difficulty and checked under multiple AP, lateral, and  oblique radiographs.  Once this was performed, we then very carefully and cautiously reamed just the outer cortex and then placed a Micro Acutrak screw size 18-20 mm in length.  Coaptation of the cancellous surfaces under compression was accomplished and there were no complicating issues, features, or other pertinents.  Once this was noted, we then very carefully and cautiously irrigated and took 4- view radiographic series as well as live fluoro, which demonstrated coaptation of the fragment nicely.  Following this, we irrigated and then closed the capsule with 4-0 Vicryl and the skin edge with Prolene. The patient tolerated this well.  Thumb spica cast was placed.  He will be monitored in the recovery room.  We will discharge him and see him back in the office in approximately 12-14 days with 4-view radiograph series.  These notes have been discussed.  All questions have been encouraged and answered.  Should any problems arise, will be immediately available, otherwise look forward to seeing him back as outlined in 12-14 days. This was an uncomplicated ORIF.  I do feel this gives him the maximum opportunity to heal.  We will request a bone stimulator of course.     Dionne AnoWilliam M. Amanda PeaGramig, M.D.  WMG/MEDQ  D:  11/14/2016  T:  11/14/2016  Job:  [id number]

## 2016-11-25 ENCOUNTER — Telehealth: Payer: Self-pay | Admitting: Sports Medicine

## 2016-11-25 ENCOUNTER — Ambulatory Visit: Payer: BLUE CROSS/BLUE SHIELD | Admitting: Sports Medicine

## 2016-11-25 NOTE — Telephone Encounter (Signed)
Dr. Karie Schwalbe this patient called at 9:10am to adv that he needed to cancel his appointment so sorry for last min but he almost forgot that he had this appointment because it was scheduled on 11/04/16 before he knew his wrist was broken and hes had surgery on his wrist since that appointment so the f/up to recheck wrist wouldn't be necessary. Just an F.Y.I do you want me to leave him on the schedule since he called to cancel last min or take off. Thanks

## 2016-11-25 NOTE — Telephone Encounter (Signed)
Take him off.

## 2016-11-26 NOTE — Telephone Encounter (Signed)
Okay I canceled the appointment thank you

## 2017-05-20 ENCOUNTER — Emergency Department
Admission: EM | Admit: 2017-05-20 | Discharge: 2017-05-20 | Disposition: A | Discharge status: Home / Self Care | Payer: BLUE CROSS/BLUE SHIELD | Source: Home / Self Care | Attending: Emergency Medicine | Admitting: Emergency Medicine

## 2017-05-20 ENCOUNTER — Other Ambulatory Visit: Payer: Self-pay

## 2017-05-20 DIAGNOSIS — H6501 Acute serous otitis media, right ear: Secondary | ICD-10-CM

## 2017-05-20 DIAGNOSIS — L700 Acne vulgaris: Secondary | ICD-10-CM

## 2017-05-20 DIAGNOSIS — J0100 Acute maxillary sinusitis, unspecified: Secondary | ICD-10-CM

## 2017-05-20 LAB — POCT RAPID STREP A (OFFICE): Rapid Strep A Screen: NEGATIVE

## 2017-05-20 MED ORDER — MUPIROCIN CALCIUM 2 % EX CREA: TOPICAL_CREAM | CUTANEOUS | 0 refills | Status: AC

## 2017-05-20 MED ORDER — DOXYCYCLINE HYCLATE 100 MG PO CAPS: 100.0000 mg | ORAL_CAPSULE | Freq: Two times a day (BID) | ORAL | 0 refills | Status: DC

## 2017-05-20 MED ORDER — AMOXICILLIN 875 MG PO TABS: 875.0000 mg | ORAL_TABLET | Freq: Two times a day (BID) | ORAL | 0 refills | Status: AC

## 2017-05-20 NOTE — ED Provider Notes (Addendum)
[facility]    CSN: [id number] Arrival date & time: 05/20/17  1012     History   Chief Complaint Chief Complaint  Patient presents with  . Facial Pain    HPI James Butler is a 42 y.o. male.  Patient presents with 2 day history of right-sided facial discomfort associated with pain in his right ear. He has a history of severe cystic acne. He has not had any fevers or chills. He does have a history of allergies but not this particular time of year. He denies any hearing loss to his right ear. He has not been taking any medications he has not been systemically ill. He denies any fevers chills or sweats.Marland Kitchen. HPI  Past Medical History:  Diagnosis Date  . Arthritis    neck, right hand  . Asthma    1 asthma attack 8 yrs. old, no problems now  . Depression   . GERD (gastroesophageal reflux disease)   . Heart murmur    as a child, no evidence now  . IBS (irritable bowel syndrome)   . Migraine     Patient Active Problem List   Diagnosis Date Noted  . Occult closed fracture of scaphoid, left, initial encounter 11/04/2016  . Migraine headache with aura 08/03/2016  . Mass of lower leg, right 11/05/2015  . Left foot pain 10/25/2014  . Left lumbar radiculitis 02/17/2013  . Cervical radiculitis 01/13/2013    Past Surgical History:  Procedure Laterality Date  . COLONOSCOPY    . HERNIA REPAIR    . ORIF SCAPHOID FRACTURE Left 11/14/2016   Procedure: OPEN REDUCTION INTERNAL FIXATION (ORIF) SCAPHOID FRACTURE;  Surgeon: Dominica SeverinGramig, William, MD;  Location: [facility] OR;  Service: Orthopedics;  Laterality: Left;  90 mins  . TONSILLECTOMY    . WRIST SURGERY     R  . WRIST SURGERY         Home Medications    Prior to Admission medications   Medication Sig Start Date End Date Taking? Authorizing Provider  calcium elemental as carbonate (TUMS ULTRA 1000) 400 MG chewable tablet Chew 1,000 mg by mouth daily as needed for heartburn.    [provider]  cyclobenzaprine (FLEXERIL)  10 MG tablet Take 1 tablet (10 mg total) by mouth 3 (three) times daily as needed. Patient taking differently: Take 10 mg by mouth 3 (three) times daily as needed for muscle spasms.  11/04/16   Monica Bectonhekkekandam, Thomas J, MD  fexofenadine (ALLEGRA) 180 MG tablet Take 180 mg by mouth daily as needed for allergies or rhinitis.    [provider]  ibuprofen (ADVIL,MOTRIN) 200 MG tablet Take 400 mg by mouth every 4 (four) hours as needed for moderate pain.    [provider]  meloxicam (MOBIC) 15 MG tablet One tab PO qAM with breakfast for 2 weeks, then daily prn pain. Patient taking differently: Take 15 mg by mouth daily as needed for pain.  11/04/16   Monica Bectonhekkekandam, Thomas J, MD  mupirocin ointment (BACTROBAN) 2 % Apply to affected area TID for 7 days. Patient not taking: Reported on 11/11/2016 08/14/16   Rodolph Bongorey, Evan S, MD  pantoprazole (PROTONIX) 40 MG tablet Take 40 mg by mouth daily.    [provider]  rizatriptan (MAXALT-MLT) 10 MG disintegrating tablet Take 1 tablet (10 mg total) by mouth as needed for migraine. May repeat in 2 hours if needed 08/03/16   Monica Bectonhekkekandam, Thomas J, MD    Family History Family History  Problem Relation Age  of Onset  . Cancer Mother        breast  . Cancer Father        esophageal  . Diabetes Father     Social History Social History   Tobacco Use  . Smoking status: Former Smoker    Types: Cigarettes    Last attempt to quit: 11/18/1998    Years since quitting: 18.5  . Smokeless tobacco: Never Used  Substance Use Topics  . Alcohol use: Yes    Comment: 2-3 beers per night  . Drug use: No     Allergies   Shellfish allergy and Pollen extract   Review of Systems Review of Systems  Constitutional: Negative.   HENT: Positive for congestion, ear pain, sinus pressure and sore throat.   Eyes: Negative.   Skin:       Patient has a long history of acne. He received multiple courses of Accutane as a young person. He also has increased  redness of his 4 head and a pustular lesion on his chin. Culture was done of this area.     Physical Exam Triage Vital Signs ED Triage Vitals  Enc Vitals Group     BP 05/20/17 1034 (!) 141/97     Pulse Rate 05/20/17 1034 91     Resp --      Temp 05/20/17 1034 98 F (36.7 C)     Temp Source 05/20/17 1034 Oral     SpO2 05/20/17 1034 98 %     Weight 05/20/17 1037 178 lb (80.7 kg)     Height 05/20/17 1037 5\' 11"  (1.803 m)     Head Circumference --      Peak Flow --      Pain Score 05/20/17 1037 2     Pain Loc --      Pain Edu? --      Excl. in GC? --    No data found.  Updated Vital Signs BP (!) 141/97 (BP Location: Right Arm)   Pulse 91   Temp 98 F (36.7 C) (Oral)   Ht 5\' 11"  (1.803 m)   Wt 178 lb (80.7 kg)   SpO2 98%   BMI 24.83 kg/m   Visual Acuity Right Eye Distance:   Left Eye Distance:   Bilateral Distance:    Right Eye Near:   Left Eye Near:    Bilateral Near:     Physical Exam  Constitutional: He appears well-developed and well-nourished.  HENT:  Head: Normocephalic and atraumatic.  Right Ear: External ear normal.  Left Ear: External ear normal.  Mouth/Throat: Oropharynx is clear and moist. No oropharyngeal exudate.  TM shows mild bulging on the right without redness.  Eyes: Pupils are equal, round, and reactive to light.  Neck: Normal range of motion. Neck supple.  Pulmonary/Chest: Effort normal and breath sounds normal.  Skin:  There is scarring acne present over the 4 head both cheeks and chin. There is redness over the scarring acne of the forehead. This area was not tender to touch. There were some areas of crusting over the forehead and the chin.      UC Treatments / Results  Labs (all labs ordered are listed, but only abnormal results are displayed) Labs Reviewed  WOUND CULTURE  POCT RAPID STREP A (OFFICE)    EKG  EKG Interpretation None       Radiology No results found.  Procedures Procedures (including critical care  time)  Medications Ordered in UC Medications - No  data to display   Initial Impression / Assessment and Plan / UC Course  I have reviewed the triage vital signs and the nursing notes.  Pertinent labs & imaging results that were available during my care of the patient were reviewed by me and considered in my medical decision making (see chart for details). Strep test was negative. Will treat with amoxicillin for now to cover his ear and sinuses. His acne has been a chronic problem. We will see what his culture shows of the pustule.  There was some fluid behind the right TM and I encouraged him to use saline nasal spray for irrigation. Will add Bactroban cream to use pending culture results regarding the pustules on his forehead and chin. Patient did not appear toxic and was afebrile making erysipelas a much less likely diagnosis.      Final Clinical Impressions(s) / UC Diagnoses   Final diagnoses:  Acute maxillary sinusitis, recurrence not specified  Acne vulgaris  Right acute serous otitis media, recurrence not specified    ED Discharge Orders    None     Take antibiotics as instructed. Use saline nasal spray to open up your sinuses. Consider an appointment with the dermatologist to see if there are any new therapies to help with your acne. Recheck if symptoms are persistent. Clean acne areas on your forehead and chin with soap and water twice a day. Use Bactroban twice a day to the areas on your forehead and and chin.  Controlled Substance Prescriptions [place] Controlled Substance Registry consulted? Not Applicable   Collene Gobble, MD 05/20/17 1107    Collene Gobble, MD 05/20/17 1111    Collene Gobble, MD 05/20/17 [phone]

## 2017-05-20 NOTE — ED Triage Notes (Signed)
Sinus pressure on the right side of face, and rash across forehead.

## 2017-05-20 NOTE — Discharge Instructions (Signed)
Take antibiotics as instructed. Use saline nasal spray to open up your sinuses. Consider an appointment with the dermatologist to see if there are any new therapies to help with your acne. Recheck if symptoms are persistent. Patient advised if the redness on his forehead progresses or he becomes ill with fever or chills to return to clinic immediately. Patient understands.

## 2017-05-27 ENCOUNTER — Telehealth: Payer: Self-pay | Admitting: Emergency Medicine

## 2017-05-27 ENCOUNTER — Telehealth: Payer: Self-pay | Admitting: *Deleted

## 2017-05-27 LAB — WOUND CULTURE
MICRO NUMBER: 90293994
SPECIMEN QUALITY:: ADEQUATE

## 2017-05-27 NOTE — Telephone Encounter (Signed)
Callback: No answer, LMOM to return call to office if not very well healed by now.

## 2017-05-27 NOTE — Telephone Encounter (Signed)
Culture returned positive for staph aureus pansensitive.  Message left on his answering machine.  We will need to contact patient and change to cephalexin 500 twice daily for 10 days I left a message for him to call in the morning.  He can stop the antibiotic he is currently taking.

## 2017-05-28 ENCOUNTER — Telehealth: Payer: Self-pay

## 2017-05-28 ENCOUNTER — Emergency Department
Admission: EM | Admit: 2017-05-28 | Discharge: 2017-05-28 | Discharge status: Absent without leave | Payer: BLUE CROSS/BLUE SHIELD | Source: Home / Self Care

## 2017-05-28 NOTE — Telephone Encounter (Signed)
Pt came in today regarding script for wound culture.  Script called to the [facility] on [address].  Doxycycline 100 mg 1 po BID #20.

## 2017-06-08 IMAGING — CR DG WRIST COMPLETE 3+V*R*
4 series · 4 of 4 positions shown · non-contrast
Comparison: 10/21/2009

CLINICAL DATA: Initial encounter for radial sided pain after trauma
10 days ago. Surgery 4 years ago.

EXAM:
RIGHT WRIST - COMPLETE 3+ VIEW

[wrist pa]
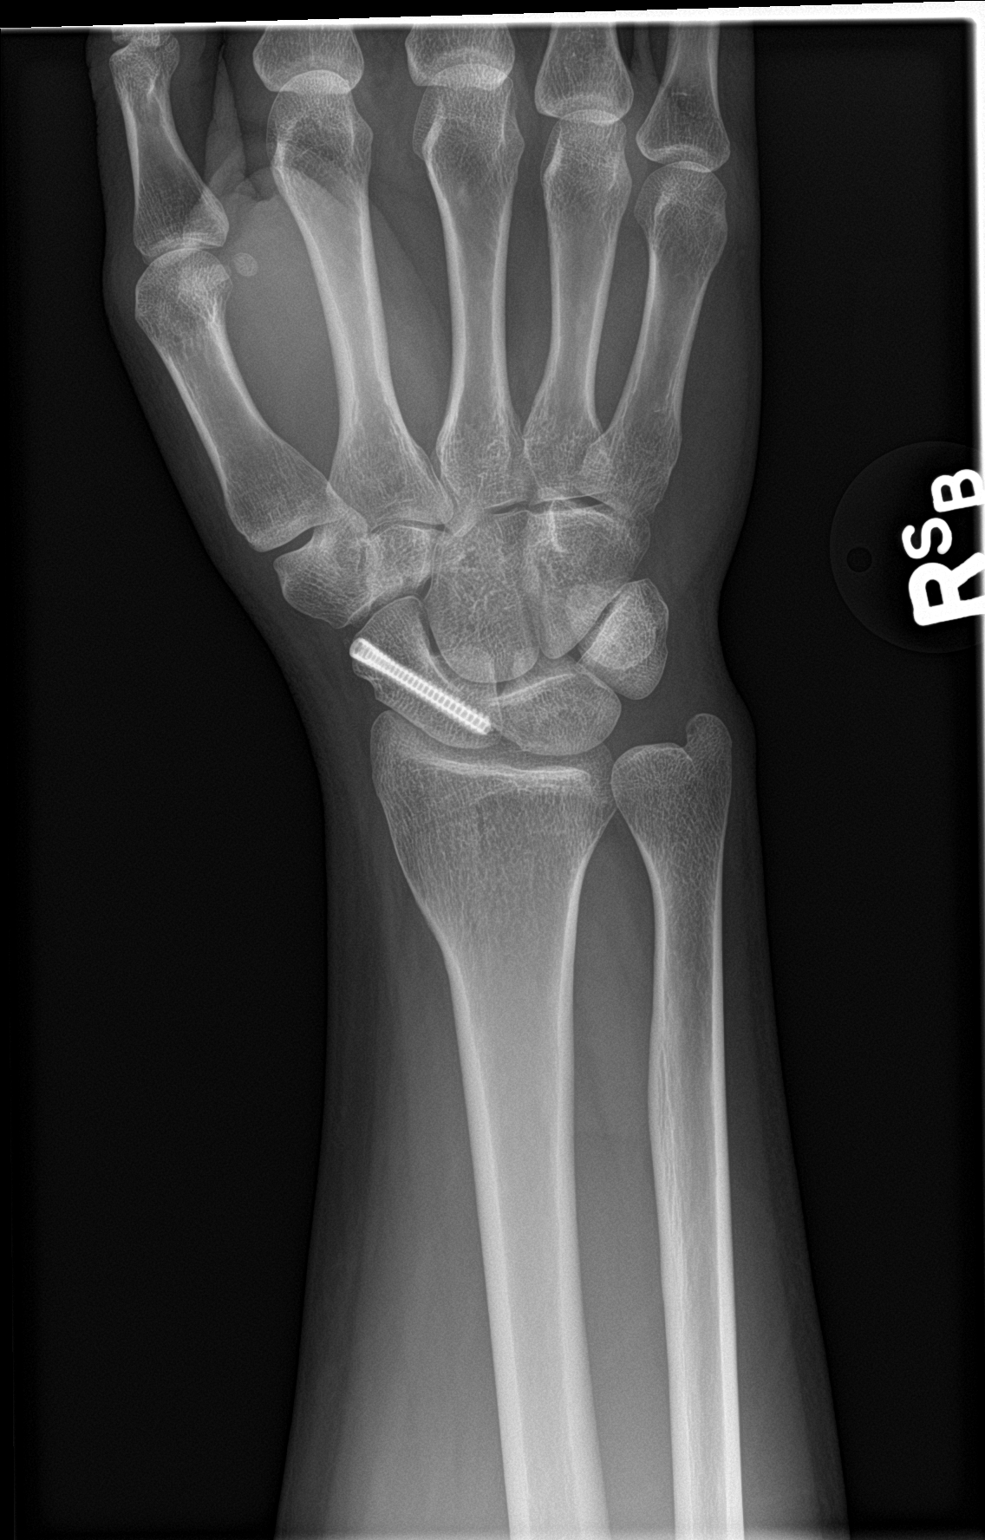

[wrist obl]
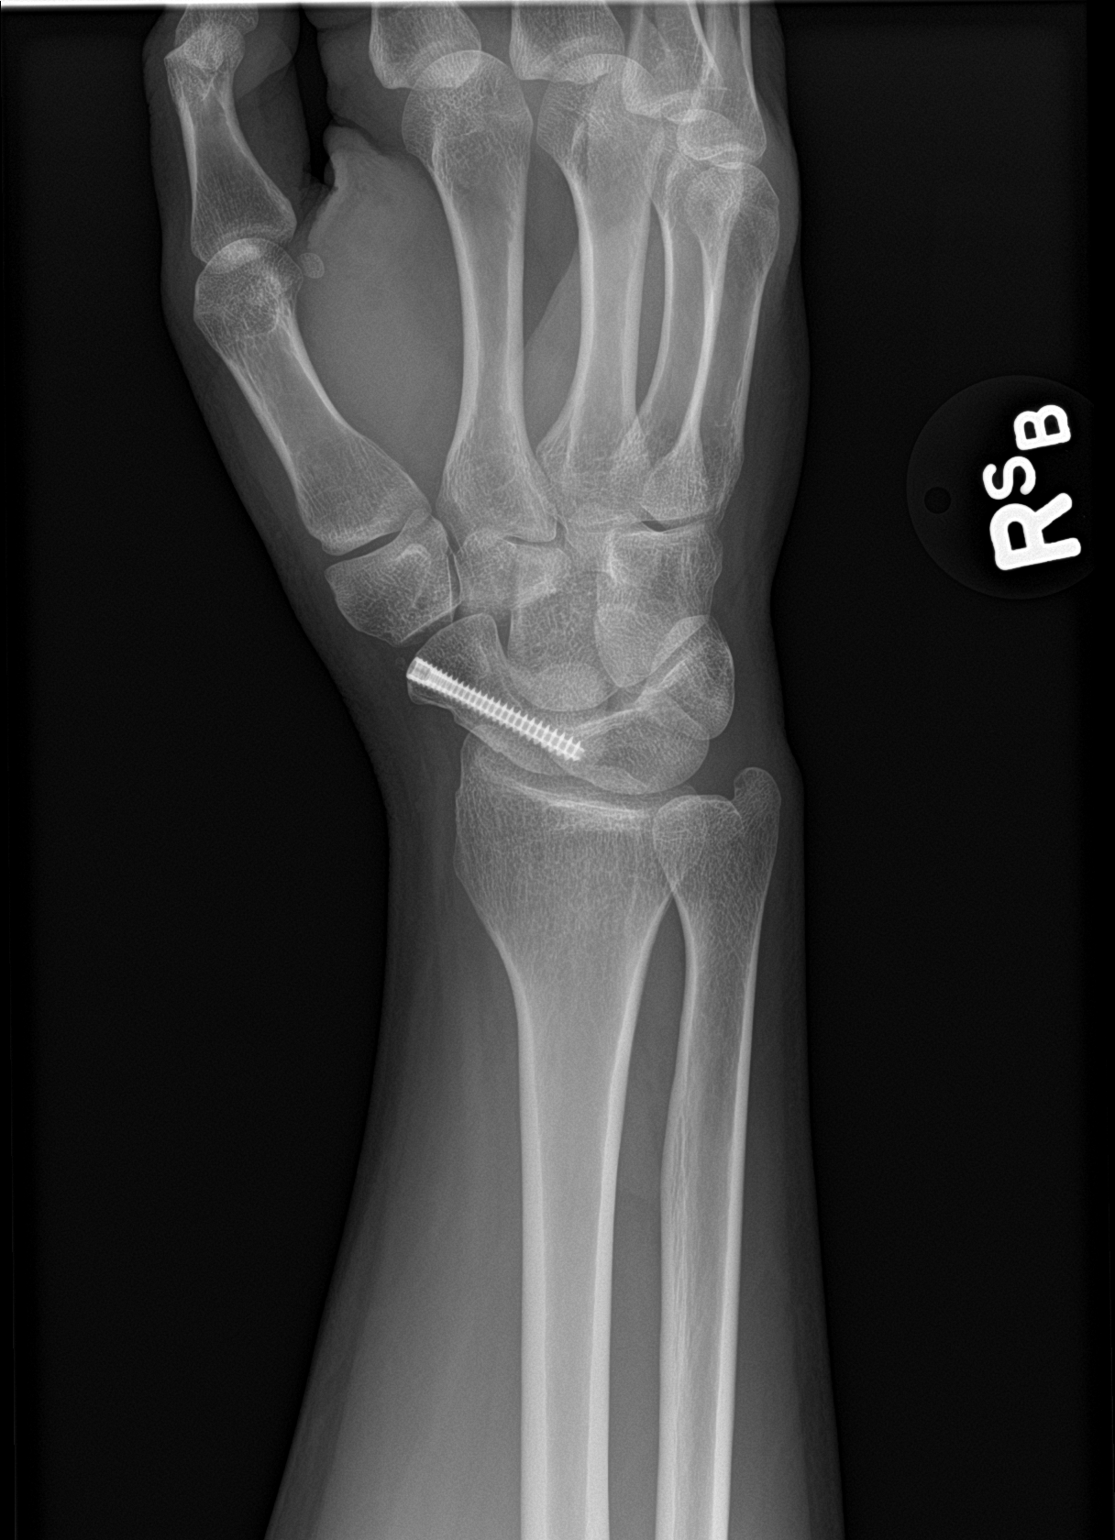

[wrist lat]
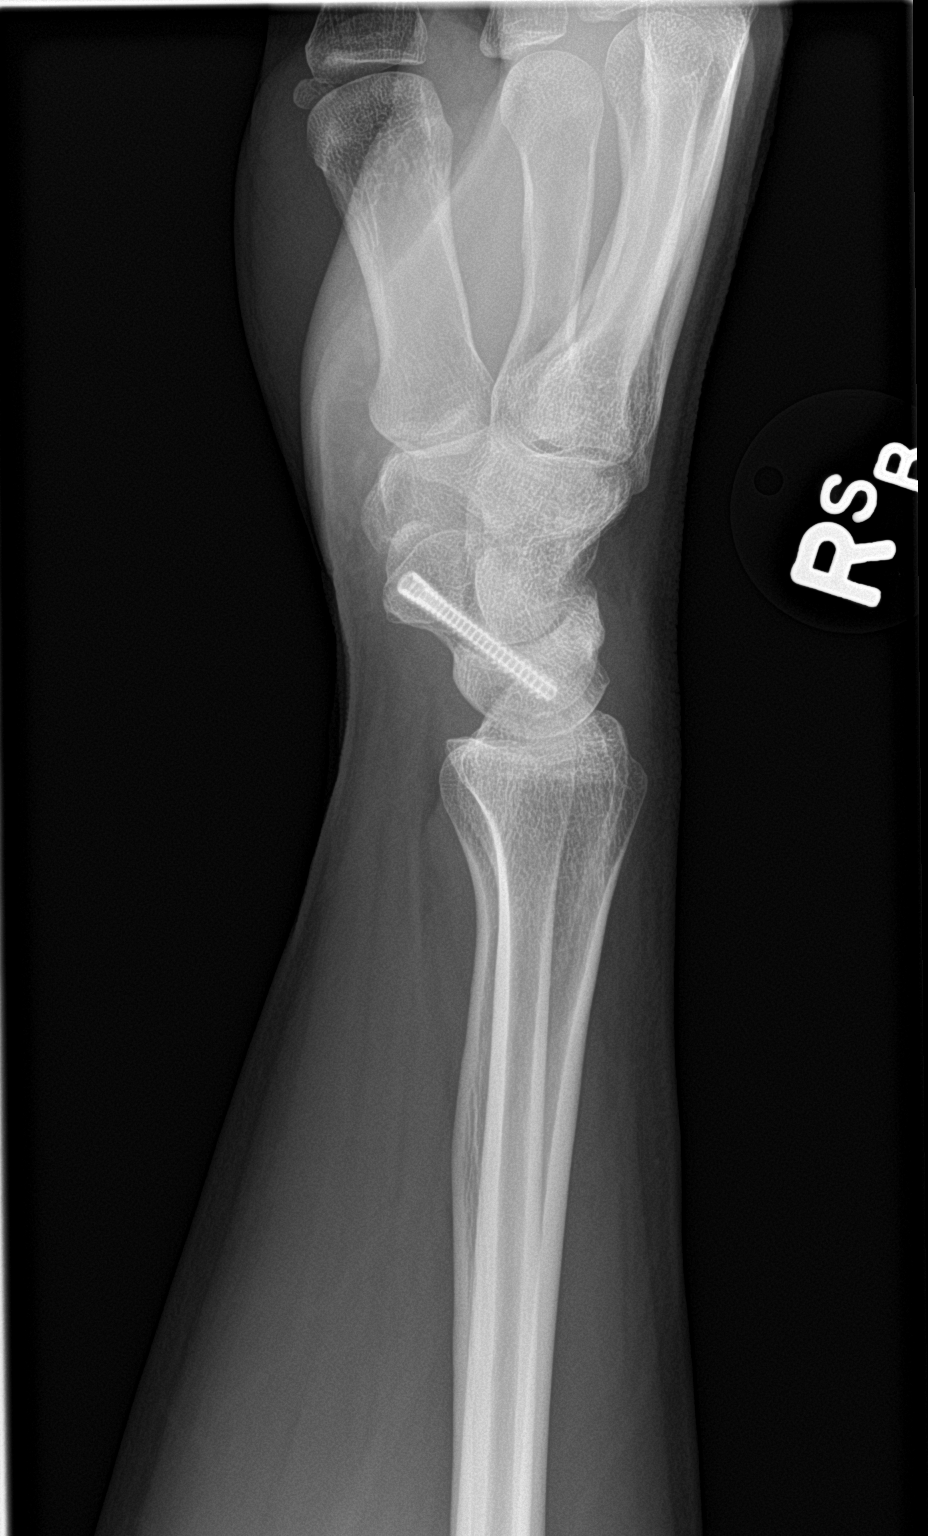

[wrist navicular]
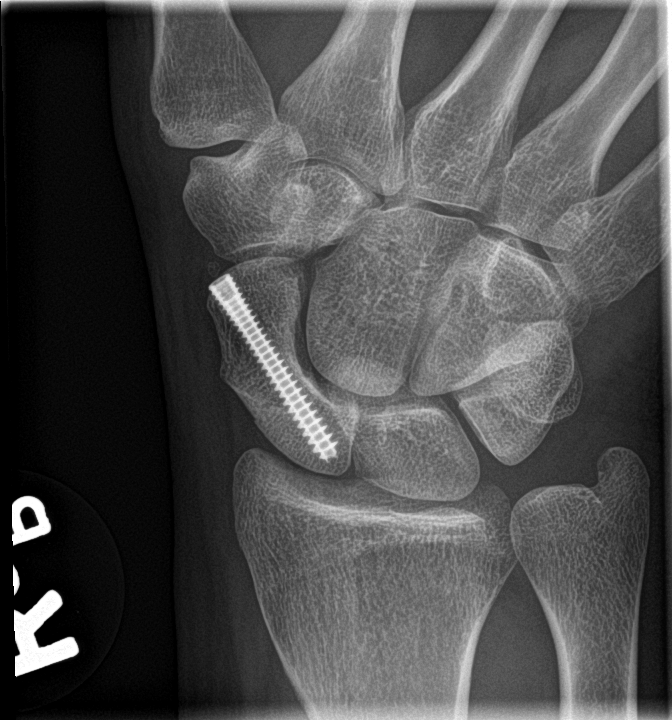

[4 of 4 positions shown; findings below may reference images not displayed]

FINDINGS: Screw fixation of the scaphoid. No acute fracture or dislocation. No
acute hardware complication.
IMPRESSION: No acute osseous abnormality.

## 2019-03-17 DEATH — deceased
